# Patient Record
Sex: Female | Born: 1960 | Race: White | Hispanic: No | Smoking: Former smoker
Health system: Southern US, Community
[De-identification: ages and names within clinical notes are randomized; demographics above are authoritative.]

## PROBLEM LIST (undated history)

## (undated) DIAGNOSIS — E049 Nontoxic goiter, unspecified: Secondary | ICD-10-CM

## (undated) DIAGNOSIS — K219 Gastro-esophageal reflux disease without esophagitis: Secondary | ICD-10-CM

## (undated) DIAGNOSIS — T4145XA Adverse effect of unspecified anesthetic, initial encounter: Secondary | ICD-10-CM

## (undated) DIAGNOSIS — M1990 Unspecified osteoarthritis, unspecified site: Secondary | ICD-10-CM

## (undated) DIAGNOSIS — J189 Pneumonia, unspecified organism: Secondary | ICD-10-CM

## (undated) DIAGNOSIS — G4733 Obstructive sleep apnea (adult) (pediatric): Secondary | ICD-10-CM

## (undated) DIAGNOSIS — T7840XA Allergy, unspecified, initial encounter: Secondary | ICD-10-CM

## (undated) DIAGNOSIS — Z9889 Other specified postprocedural states: Secondary | ICD-10-CM

## (undated) DIAGNOSIS — N3281 Overactive bladder: Secondary | ICD-10-CM

## (undated) DIAGNOSIS — E041 Nontoxic single thyroid nodule: Secondary | ICD-10-CM

## (undated) DIAGNOSIS — Q828 Other specified congenital malformations of skin: Secondary | ICD-10-CM

## (undated) DIAGNOSIS — R112 Nausea with vomiting, unspecified: Secondary | ICD-10-CM

## (undated) DIAGNOSIS — R002 Palpitations: Secondary | ICD-10-CM

## (undated) DIAGNOSIS — Z87442 Personal history of urinary calculi: Secondary | ICD-10-CM

## (undated) DIAGNOSIS — R519 Headache, unspecified: Secondary | ICD-10-CM

## (undated) DIAGNOSIS — I10 Essential (primary) hypertension: Secondary | ICD-10-CM

## (undated) DIAGNOSIS — R51 Headache: Secondary | ICD-10-CM

## (undated) HISTORY — DX: Other specified congenital malformations of skin: Q82.8

## (undated) HISTORY — PX: ESOPHAGOGASTRODUODENOSCOPY: SHX1529

## (undated) HISTORY — DX: Obstructive sleep apnea (adult) (pediatric): G47.33

## (undated) HISTORY — PX: TUBAL LIGATION: SHX77

## (undated) HISTORY — DX: Overactive bladder: N32.81

## (undated) HISTORY — DX: Nontoxic goiter, unspecified: E04.9

## (undated) HISTORY — DX: Allergy, unspecified, initial encounter: T78.40XA

## (undated) HISTORY — PX: CARDIAC CATHETERIZATION: SHX172

## (undated) HISTORY — DX: Nontoxic single thyroid nodule: E04.1

## (undated) HISTORY — PX: COLONOSCOPY: SHX174

## (undated) HISTORY — PX: CHOLECYSTECTOMY: SHX55

## (undated) HISTORY — PX: KIDNEY SURGERY: SHX687

## (undated) HISTORY — DX: Palpitations: R00.2

## (undated) HISTORY — DX: Essential (primary) hypertension: I10

---

## 1981-09-20 HISTORY — PX: OTHER SURGICAL HISTORY: SHX169

## 1995-09-21 HISTORY — PX: OTHER SURGICAL HISTORY: SHX169

## 2002-04-06 ENCOUNTER — Ambulatory Visit (HOSPITAL_COMMUNITY): Admission: RE | Admit: 2002-04-06 | Discharge: 2002-04-06 | Payer: Self-pay | Admitting: Cardiology

## 2002-04-09 ENCOUNTER — Encounter: Admission: RE | Admit: 2002-04-09 | Discharge: 2002-04-09 | Payer: Self-pay | Admitting: Cardiology

## 2002-04-24 ENCOUNTER — Ambulatory Visit (HOSPITAL_COMMUNITY): Admission: RE | Admit: 2002-04-24 | Discharge: 2002-04-24 | Payer: Self-pay | Admitting: *Deleted

## 2002-04-24 ENCOUNTER — Encounter: Payer: Self-pay | Admitting: *Deleted

## 2002-05-09 ENCOUNTER — Ambulatory Visit (HOSPITAL_COMMUNITY): Admission: RE | Admit: 2002-05-09 | Discharge: 2002-05-09 | Payer: Self-pay | Admitting: *Deleted

## 2002-05-21 HISTORY — PX: CHOLECYSTECTOMY, LAPAROSCOPIC: SHX56

## 2002-05-29 ENCOUNTER — Encounter (INDEPENDENT_AMBULATORY_CARE_PROVIDER_SITE_OTHER): Payer: Self-pay

## 2002-05-29 ENCOUNTER — Encounter: Payer: Self-pay | Admitting: General Surgery

## 2002-05-29 ENCOUNTER — Observation Stay (HOSPITAL_COMMUNITY): Admission: RE | Admit: 2002-05-29 | Discharge: 2002-05-30 | Payer: Self-pay | Admitting: General Surgery

## 2002-06-26 ENCOUNTER — Other Ambulatory Visit: Admission: RE | Admit: 2002-06-26 | Discharge: 2002-06-26 | Payer: Self-pay | Admitting: *Deleted

## 2003-08-27 ENCOUNTER — Other Ambulatory Visit: Admission: RE | Admit: 2003-08-27 | Discharge: 2003-08-27 | Payer: Self-pay | Admitting: *Deleted

## 2003-09-17 ENCOUNTER — Encounter: Admission: RE | Admit: 2003-09-17 | Discharge: 2003-09-17 | Payer: Self-pay | Admitting: Family Medicine

## 2003-10-25 ENCOUNTER — Encounter: Admission: RE | Admit: 2003-10-25 | Discharge: 2003-10-25 | Payer: Self-pay | Admitting: Family Medicine

## 2004-01-28 ENCOUNTER — Encounter: Admission: RE | Admit: 2004-01-28 | Discharge: 2004-01-28 | Payer: Self-pay | Admitting: Family Medicine

## 2004-05-14 IMAGING — US US TRANSVAGINAL NON-OB
1 series · 14 of 25 positions shown · non-contrast
Comparison: none

CLINICAL DATA: Pelvic pain. 
 TRANSABDOMINAL AND ENDOVAGINAL ULTRASOUND
 The uterus measures 11.7 x 4.5 x 5.8 cm.  The endometrial stripe measures88 mm in greatest diameter.  Tiny heterogeneous focus in the fundus of the uterus likely reflects an incidental fibroid.  There are no endometrial fluid collections.

 The right ovary measures 2.9 x 1.8 x 1.7 cm.  The left ovary measures 4.2 x 2.1 x 2.8 cm.  Negative for pelvic masses.  A trace amount of fluid is visualized which is nonspecific. 
 IMPRESSION
 Probable incidental fibroid within the fundus of the uterus.  Ultrasound is otherwise negative.

[Series 1: unknown · 0.24mm/px · 14 of 57 slices shown]
[im 1/57]
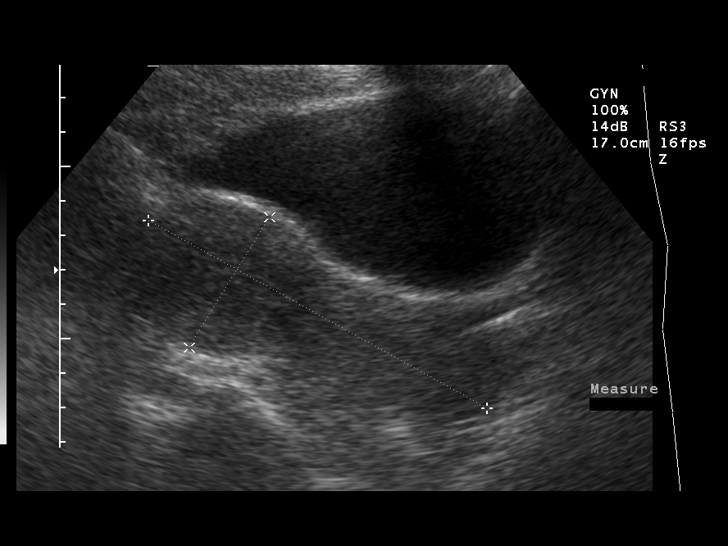
[im 5/57]
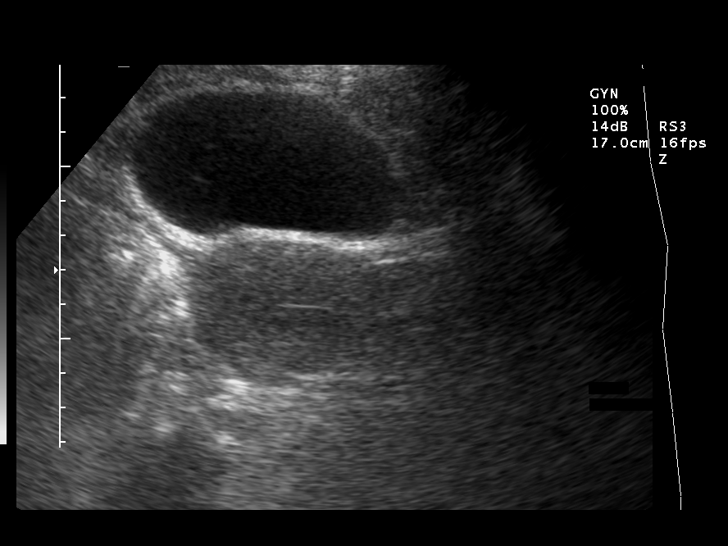
[im 10/57]
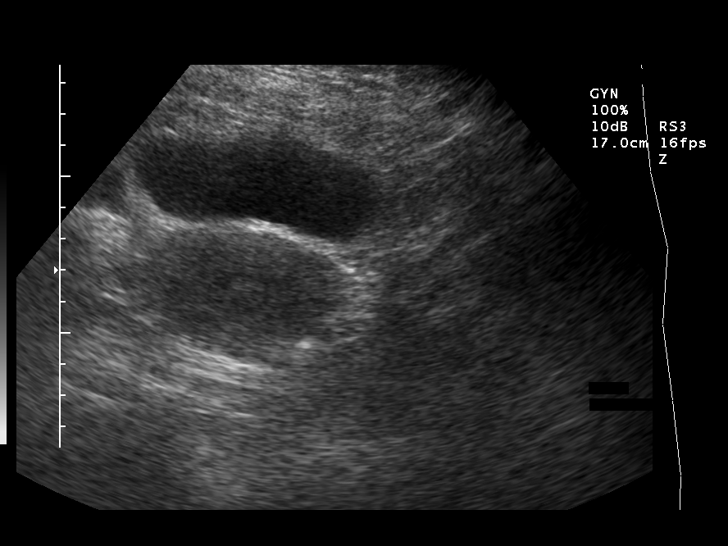
[im 15/57]
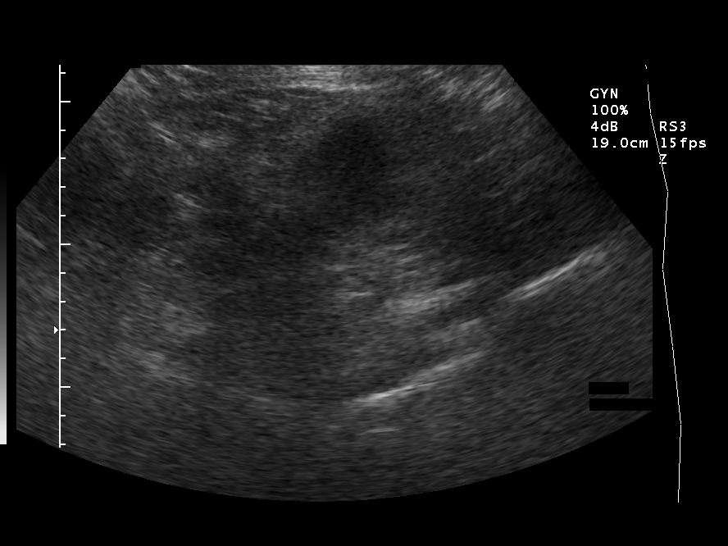
[im 19/57]
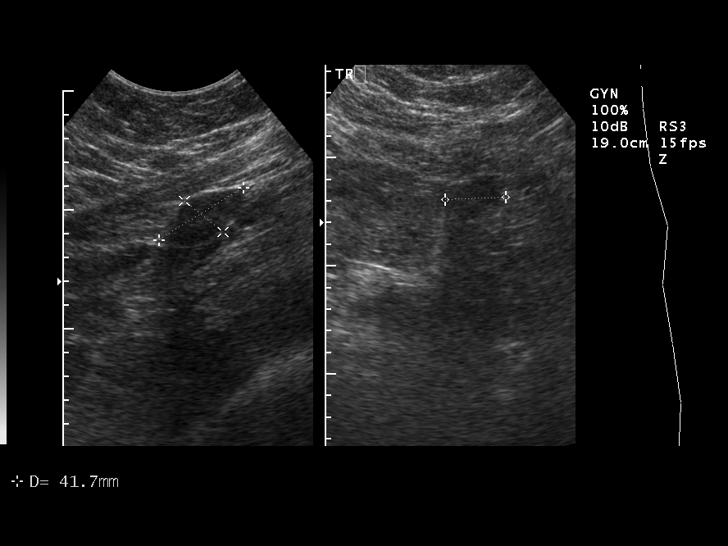
[im 22/57]
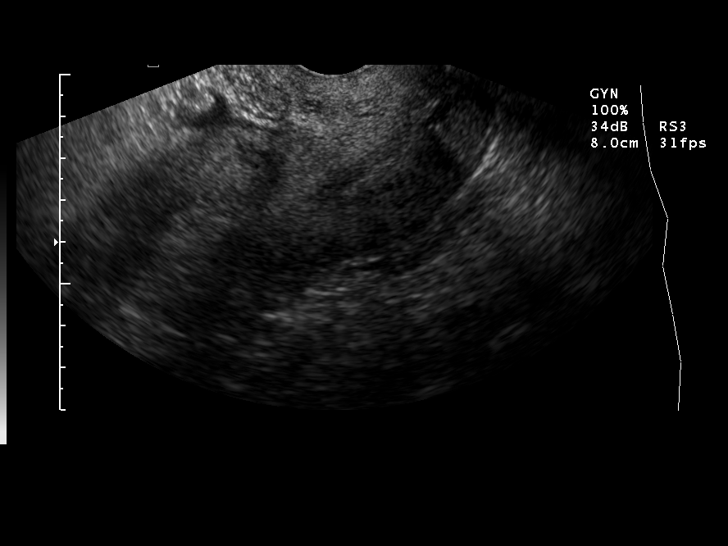
[im 26/57]
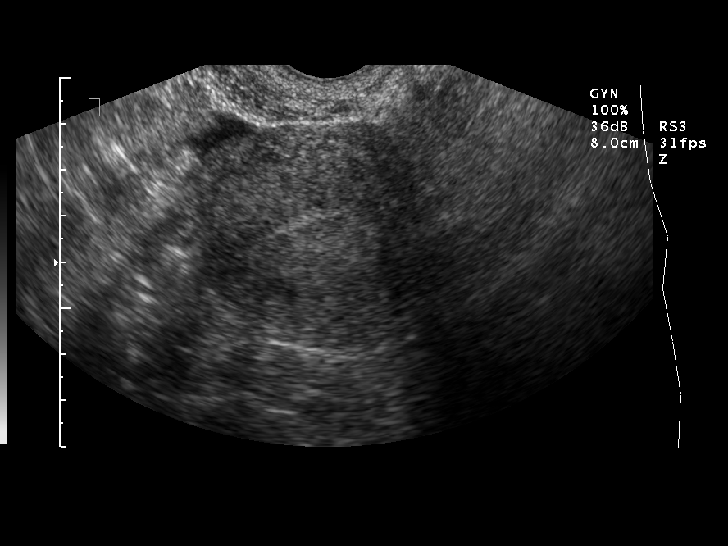
[im 31/57]
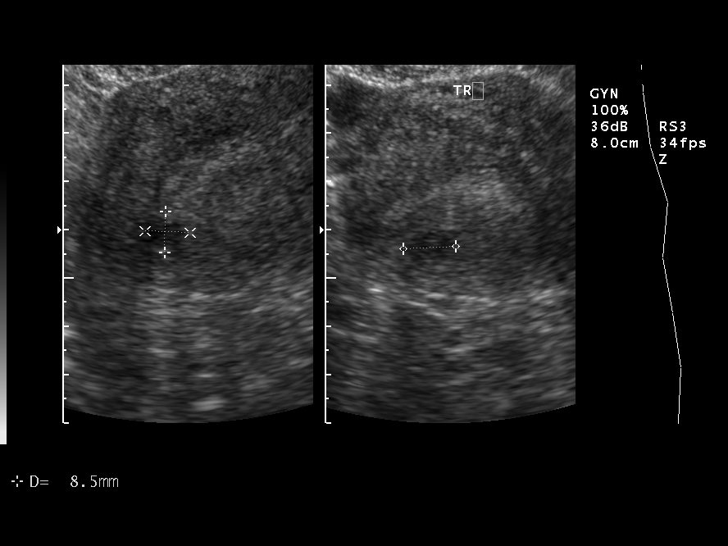
[im 36/57]
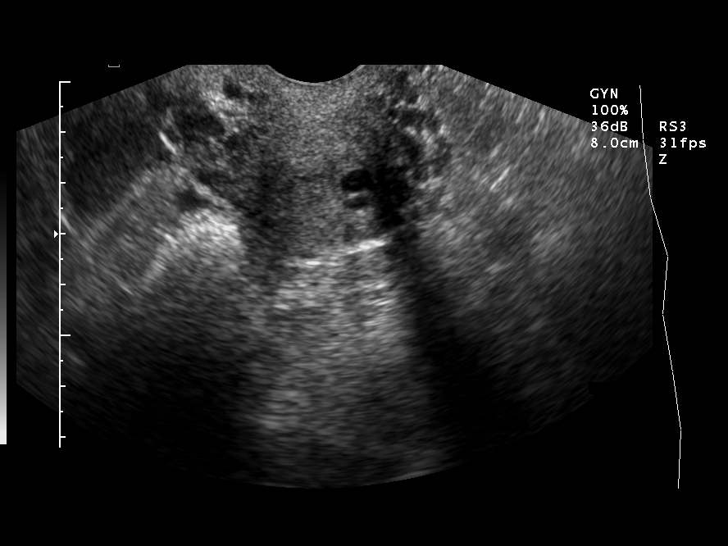
[im 38/57]
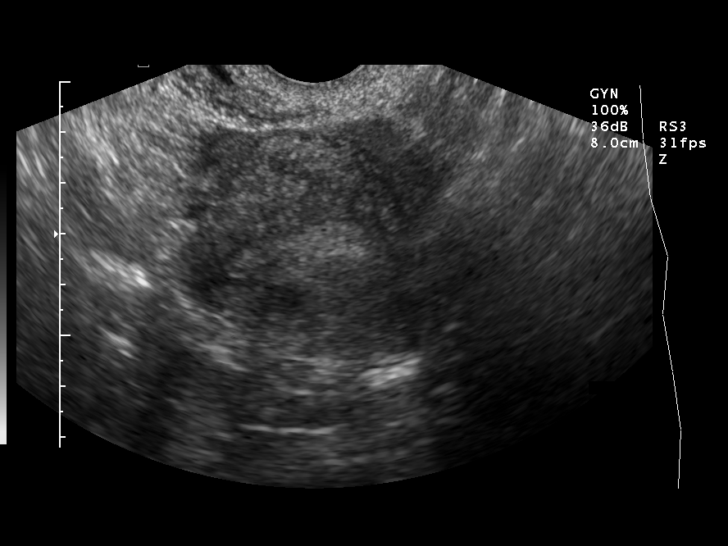
[im 43/57]
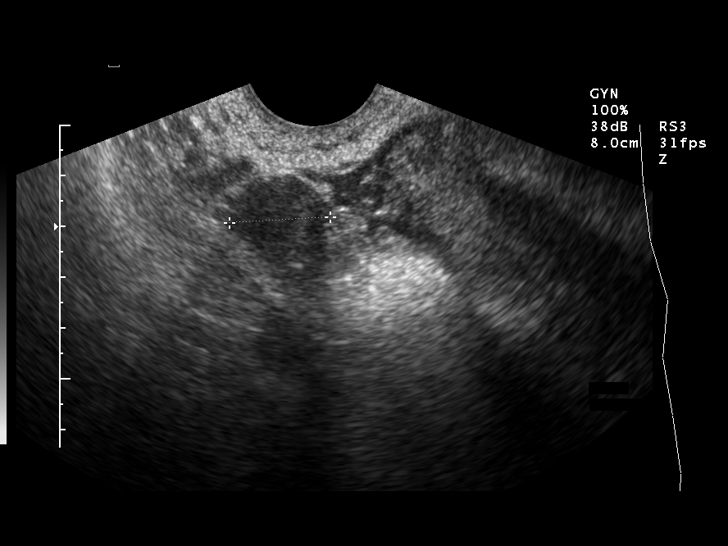
[im 47/57]
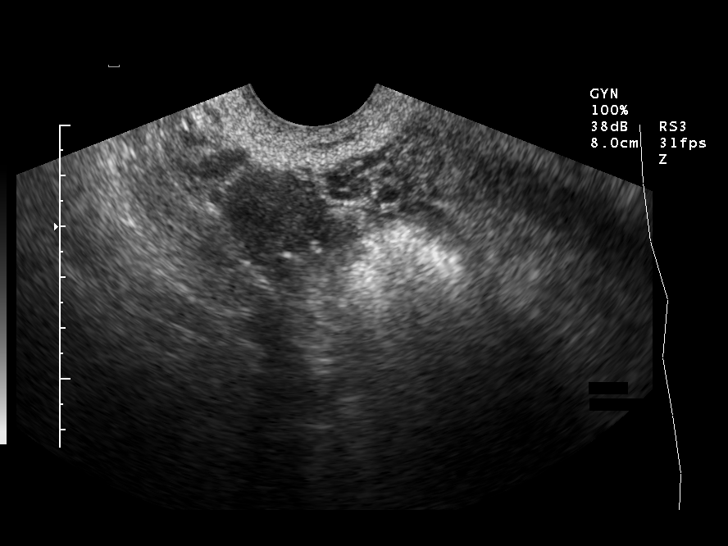
[im 52/57]
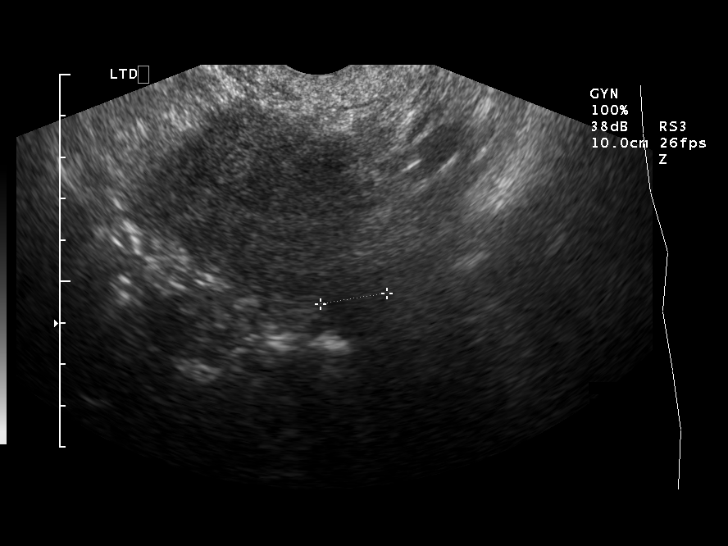
[im 57/57]
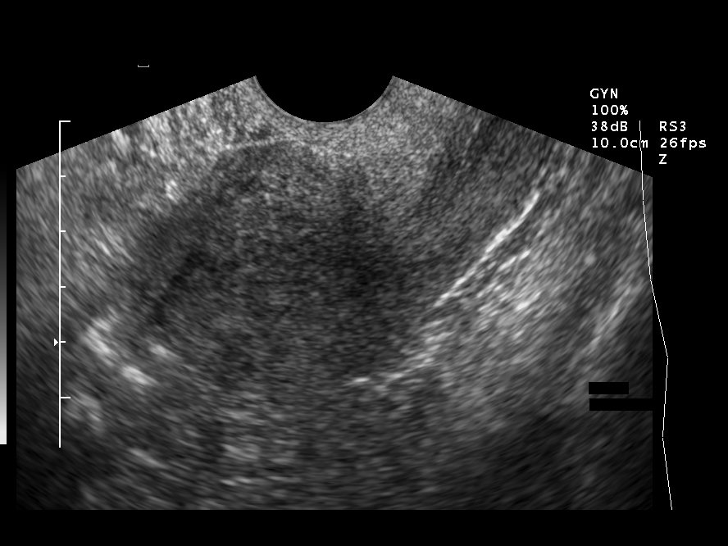

[14 of 25 positions shown; findings below may reference images not displayed]

## 2004-08-31 ENCOUNTER — Inpatient Hospital Stay (HOSPITAL_COMMUNITY): Admission: AD | Admit: 2004-08-31 | Discharge: 2004-09-03 | Payer: Self-pay | Admitting: *Deleted

## 2005-04-28 ENCOUNTER — Other Ambulatory Visit: Admission: RE | Admit: 2005-04-28 | Discharge: 2005-04-28 | Payer: Self-pay | Admitting: *Deleted

## 2005-05-12 ENCOUNTER — Encounter: Admission: RE | Admit: 2005-05-12 | Discharge: 2005-05-12 | Payer: Self-pay | Admitting: Family Medicine

## 2005-05-19 ENCOUNTER — Encounter: Admission: RE | Admit: 2005-05-19 | Discharge: 2005-05-19 | Payer: Self-pay | Admitting: Family Medicine

## 2006-06-06 ENCOUNTER — Other Ambulatory Visit: Admission: RE | Admit: 2006-06-06 | Discharge: 2006-06-06 | Payer: Self-pay | Admitting: *Deleted

## 2006-06-15 ENCOUNTER — Encounter: Admission: RE | Admit: 2006-06-15 | Discharge: 2006-06-15 | Payer: Self-pay | Admitting: Family Medicine

## 2006-06-29 ENCOUNTER — Encounter: Admission: RE | Admit: 2006-06-29 | Discharge: 2006-06-29 | Payer: Self-pay | Admitting: Family Medicine

## 2007-05-16 ENCOUNTER — Other Ambulatory Visit: Admission: RE | Admit: 2007-05-16 | Discharge: 2007-05-16 | Payer: Self-pay | Admitting: Gynecology

## 2008-05-28 ENCOUNTER — Emergency Department (HOSPITAL_COMMUNITY): Admission: EM | Admit: 2008-05-28 | Discharge: 2008-05-28 | Payer: Self-pay | Admitting: Emergency Medicine

## 2008-11-19 ENCOUNTER — Other Ambulatory Visit: Admission: RE | Admit: 2008-11-19 | Discharge: 2008-11-19 | Payer: Self-pay | Admitting: Family Medicine

## 2008-11-27 ENCOUNTER — Encounter: Admission: RE | Admit: 2008-11-27 | Discharge: 2008-11-27 | Payer: Self-pay | Admitting: Family Medicine

## 2008-11-29 ENCOUNTER — Encounter: Admission: RE | Admit: 2008-11-29 | Discharge: 2008-11-29 | Payer: Self-pay | Admitting: Family Medicine

## 2008-12-03 ENCOUNTER — Ambulatory Visit: Payer: Self-pay | Admitting: Gynecology

## 2008-12-11 ENCOUNTER — Ambulatory Visit: Payer: Self-pay | Admitting: Gynecology

## 2009-02-19 ENCOUNTER — Encounter: Admission: RE | Admit: 2009-02-19 | Discharge: 2009-02-19 | Payer: Self-pay | Admitting: Family Medicine

## 2010-10-11 ENCOUNTER — Encounter: Payer: Self-pay | Admitting: Family Medicine

## 2011-02-05 NOTE — Discharge Summary (Signed)
NAMEMICHAL, Kathryn [name] NO.:  [id number]   MEDICAL RECORD NO.:  [id number]          PATIENT TYPE:  INP   LOCATION:  5023                         FACILITY:  [facility]   PHYSICIAN:  Kela Millin, M.D.DATE OF BIRTH:  11/05/1960   DATE OF ADMISSION:  08/31/2004  DATE OF DISCHARGE:  09/03/2004                                 DISCHARGE SUMMARY   DISCHARGE DIAGNOSES:  1.  Pneumonia, bibasilar, with bronchitis.  2.  Hypokalemia - resolved.  3.  Obesity.   CONSULTATIONS:  None.   HISTORY:  The patient is a 50 year old white female who presented with  paroxysmal nonproductive cough as well as low-grade fevers and headaches.  She stated that about three weeks prior to this presentation she had had an  illness characterized by cough, headaches, and malaise that lasted about  seven days but resolved, and she did not see a doctor during that time.  During the week prior to this presentation, she stated that she was awakened  from sleep with a severe cough, and the coughing has persisted and is  keeping her up at night.  The patient stated that later the same day that  she had been awakened by the cough she went to urgent care and was given  Tussionex syrup as well as Zithromax for treatment of acute bronchitis.  She  stated that she completed the Z-PAK over five days, with not much  improvement.  On the day of presentation, the patient went to her primary  care physician's office, Dr. Clarene Duke, and had a chest x-ray done which showed  bibasilar pneumonia, and she is admitted to the [facility] hospitalist service  for further evaluation and management.   PHYSICAL EXAMINATION:  On admission, as per Dr. Sherin Quarry:  TEMPERATURE:  99.4.  PULSE:  117.  RESPIRATORY RATE:  22.  BLOOD PRESSURE:  165/96.  LUNGS:  Rhonchi at the bases.  Diminished breath sounds and wheezing with  cough.  CARDIOVASCULAR:  Normal S1, S2.  No murmurs, rubs, or gallops.  EXTREMITIES:  No cyanosis or  edema.   LABORATORY DATA:  White cell count 8.4, hemoglobin 13.5, hematocrit 39.3,  platelet count 241.  Sodium 137, potassium 3.6, chloride 105, CO2 24,  glucose 124, BUN 13, creatinine 0.4.  LFTs within normal limits.  Urinalysis  negative for infection.   HOSPITAL COURSE:  1.  Pneumonia, bibasilar.  Upon admission, blood cultures were obtained, and      the patient was empirically started on IV antibiotics - Avelox.  The      blood cultures were negative.  The patient was also started on IV      steroids on admission as well as nebulized bronchodilators - albuterol.      Following the above interventions, the wheezing improved, and the IV      Solu-Medrol was changed to prednisone, and this was tapered.  The      patient was also continued on antitussives - Tussionex, and Robitussin      DM was added p.r.n. cough.  The patient has improved clinically,  remaining afebrile and hemodynamically stable, with a decrease in her      cough, and she is tolerating p.o. well, with no November.  She will be      discharged at this time on oral antibiotics and prednisone taper, and      she is to follow up with her primary care physician, Dr. Catha Gosselin in      a week.  2.  Hypokalemia.  Her potassium was found to be decreased in the hospital,      and this was replaced.   DISCHARGE MEDICATIONS:  1.  Avelox 400 mg one p.o. daily x 7 more days.  2.  Albuterol MDI 2 puffs q.4-6 h. p.r.n.  3.  Tussionex 5 mL p.o. q.12 h. p.r.n. cough.  4.  Prednisone taper as directed.  5.  Robitussin DM 10 mL q.4-6 h. p.r.n. cough.  6.  Claritin D 10 mg one p.o. daily.   FOLLOW-UP CARE:  Dr. Catha Gosselin in one week.   DISCHARGE CONDITION:  Improved/stable.       ACV/MEDQ  D:  09/03/2004  T:  09/03/2004  Job:  [id number]   cc:   Kela Millin, M.D.   Anna Genre Little, M.D.  [address]  [place]  [place] [address]  Fax: [phone]

## 2011-02-05 NOTE — Op Note (Signed)
Kathryn [name], Kathryn [name]                         ACCOUNT NO.:  [id number]   MEDICAL RECORD NO.:  [id number]                   PATIENT TYPE:  OBV   LOCATION:  0340                                 FACILITY:  [facility]   PHYSICIAN:  Timothy E. Earlene Plater, M.D.              DATE OF BIRTH:  May 25, 1961   DATE OF PROCEDURE:  05/29/2002  DATE OF DISCHARGE:                                 OPERATIVE REPORT   PREOPERATIVE DIAGNOSIS:  Biliary dyskinesia.   POSTOPERATIVE DIAGNOSIS:  Biliary dyskinesia.   OPERATIVE PROCEDURE:  Laparoscopic cholecystectomy and operative  cholangiogram.   SURGEON:  Timothy E. Earlene Plater, M.D.   ASSISTANT:  Zigmund Daniel, M.D.   ANESTHESIA:  CRNA, supervised Jill Side, M.D.   INDICATIONS FOR PROCEDURE:  The patient is 41, obese, with persistent food-  related right upper quadrant discomfort.  Ultrasound shows fatty liver  infiltration, polyp of the gallbladder.  CCK stimulated study showed no  activity contractions of the gallbladder.  After careful consideration and  GI work-up, it has been recommended that she have a cholecystectomy, and she  agrees.   She was seen and evaluated by anesthesia, identified, and operative permit  signed.   DESCRIPTION OF PROCEDURE:  The patient was taken to the operating room,  placed supine, general endotracheal anesthesia administered.  The abdomen  was prepped, and draped in the usual fashion.  Marcaine 0.25% with  epinephrine was used prior to each incision.  An infraumbilical incision was  made, the fascia identified, opened vertically, peritoneum entered without  complication.  Hasson catheter passed, tied in place with a #1 Vicryl, the  abdomen insufflated.  Peritoneoscopy was carried out and was thought to be  normal except for the amount of fat present.  A second 10 mm trocar placed  in the mid epigastrium and two 5 mm trocars in the right upper quadrant.  The gallbladder was grasped.  It was normal in its overall  appearance.  Careful dissection at the base of the gallbladder revealed a normal-  appearing cystic duct entering the gallbladder.  This was dissected out  completely.  A clip was placed in the gallbladder.  The cystic duct was  opened, a catheter introduced percutaneously and then using real-time  fluoroscopy, dye was injected showing smooth and rapid flow of dye through  the cystic duct into the common bile duct and into the duodenum as well as  hepatic radicles filled.  We thought the common bile duct was probably  larger than usual, but no other abnormalities noted.  Catheter removed, the  cystic duct stump doubly clipped and divided.  The artery behind the cystic  duct had been triply clipped and divided.  The gallbladder was then removed  from the gallbladder bed and due to the thinness of the gallbladder, one  small nick was made in the gallbladder with some spillage of bile.  The  gallbladder was  placed in an EndoCatch bag and then removed through the  infraumbilical incision, palpated; no stones were seen, and it was passed  off the field as a surgical specimen.  Two liters of copious irrigation were  used.  The gallbladder bed was inspected.  There was no evidence or bile or  blood.  All the bile was deleted and aspirated.  With the counts correct and  the patient stable, all irrigant, CO2,  instruments, and trocars removed.  The infraumbilical incision was closed  with a #1 Vicryl under direct vision.  Counts were correct.  The skin  incisions closed with 3-0 Monocryl.  Steri-Strips applied, dry sterile  dressing, and she was removed to the recovery room in good condition.                                               Timothy E. Earlene Plater, M.D.    TED/MEDQ  D:  05/29/2002  T:  05/29/2002  Job:  [id number]   cc:   Althea Grimmer. Luther Parody, M.D.  [address], [address]  [place]  [place] [address]  Fax: [phone]   Traci R. Mayford Knife, M.D.  [address], [address]  [place]   [place] [address]  Fax: [phone]

## 2011-02-05 NOTE — H&P (Signed)
Kathryn [name], [name] NO.:  [id number]   MEDICAL RECORD NO.:  [id number]          PATIENT TYPE:  INP   LOCATION:  5023                         FACILITY:  [facility]   PHYSICIAN:  Sherin Quarry, MD      DATE OF BIRTH:  1961/03/13   DATE OF ADMISSION:  08/31/2004  DATE OF DISCHARGE:                                HISTORY & PHYSICAL   HISTORY OF PRESENT ILLNESS:  Kathryn [name] is a 50 year old lady who  indicates that about November 21, she had an illness characterized by cough,  headache and malaise.  This lasted about 7 days and then resolved.  She did  not see a doctor during this time.  She then felt well until Wednesday of  last week when she awakened from sleep with a severe cough.  Coughing has  persisted and has prevented sleep ever since.  Later the same day, she went  to Urgent Care and was given Tussionex cough syrup and Zithromax with a  diagnosis of acute bronchitis.  She took the Zithromax x5 days without much  improvement.  Since that time, she has had a persistent low-grade fever,  headache and the paroxysmal nonproductive cough.  She presented to Dr.  Fredirick Maudlin office today and a chest x-ray apparently showed bibasilar  pneumonia.  In light of this finding and her paroxysmal coughing, she is  admitted for further evaluation and treatment.   MEDICATIONS:  No medications on a regular basis.   ALLERGIES:  PENICILLIN.   PAST SURGICAL HISTORY:  1.  Cholecystectomy in 2003.  2.  Shoulder operation in 1998.  3.  Some type of surgery on her urethra when she was an infant.   PAST MEDICAL HISTORY:  She has no chronic medical illnesses.  She does  recall in 2003, she had a relatively severe episode of bronchitis.   FAMILY HISTORY:  One of her sisters has died as a result of Leukemia.  Her  brother apparently also has leukemia.  Her sister has a history of heart  disease.   SOCIAL HISTORY:  She works at a bank.  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  She does not  smoke.  She denies use of alcohol.  She has two grown children.   REVIEW OF SYSTEMS:  HEENT:  She has a chronic, bitemporal headache.  She  denies earache, sinus pain or sore throat.  CHEST:  See above.  CARDIOVASCULAR:  She denies orthopnea, PND, ankle edema or exertional chest  pain.  GASTROINTESTINAL:  She denies nausea, vomiting, abdominal pain,  change in bowel habits, melena or hematochezia.  GENITOURINARY:  She denies  dysuria, urinary frequency, hesitancy or nocturia.  RHEUMATOLOGIC:  She  denies back pain or joint pain.  HEMATOLOGIC:  She denies easy bleeding or  bruising.  NEUROLOGIC:  She denies history of seizure or stroke.   PHYSICAL EXAMINATION:  GENERAL:  She is a pleasant, cooperative woman who is  coughing profusely.  HEENT:  Within normal limits.  CHEST:  Difficult because of her  persistent coughing.  I hear a few rhonchi  at the bases.  Breath sounds seem to be somewhat diminished.  There is  definite wheezing audible with coughing.  CARDIAC:  Normal S1, S2 without murmurs, rubs or gallops.  ABDOMEN:  Obese, normal bowel sounds without masses or tenderness.  There is  no guarding or rebound.  EXTREMITIES:  Evaluation of the extremities is normal.  No cyanosis or  edema.   IMPRESSION:  1.  After review of the patient's chest x-ray, I agree that the patient has      evidence of bibasilar pneumonia and bronchitis.  We will admit her at      this time for evaluation and treatment of these problems.  2.  History of cholecystectomy.  3.  Obesity.  4.  History of shoulder surgery.  5.  Penicillin allergy.   PLAN:  1.  The patient will be given oxygen and intravenous fluids.  2.  Antibiotics in the form of Avelox 400 mg daily.  3.  Will give her nebulizer treatments with albuterol as well as intravenous      steroids in light of the persistent coughing.  4.  Pain medicines will be given as needed.  5.  Will  follow her course closely.       SY/MEDQ  D:  08/31/2004  T:  08/31/2004  Job:  [id number]   cc:   Caryn Bee L. Little, M.D.  [address]  [place]  [place] [address]  Fax: [phone]

## 2011-06-12 ENCOUNTER — Emergency Department (HOSPITAL_COMMUNITY)
Admission: EM | Admit: 2011-06-12 | Discharge: 2011-06-12 | Disposition: A | Discharge status: Home / Self Care | Payer: Self-pay | Attending: Emergency Medicine | Admitting: Emergency Medicine

## 2011-06-12 DIAGNOSIS — T7840XA Allergy, unspecified, initial encounter: Secondary | ICD-10-CM | POA: Insufficient documentation

## 2011-06-12 DIAGNOSIS — I10 Essential (primary) hypertension: Secondary | ICD-10-CM | POA: Insufficient documentation

## 2011-06-23 LAB — CBC
HCT: 41
Hemoglobin: 13.5
MCHC: 33
MCV: 86.5
Platelets: 238
RBC: 4.74
RDW: 14
WBC: 8.8

## 2011-06-23 LAB — COMPREHENSIVE METABOLIC PANEL
ALT: 28
AST: 23
Alkaline Phosphatase: 93
CO2: 25
Calcium: 9.1
Chloride: 106
GFR calc Af Amer: >60
GFR calc non Af Amer: >60
Glucose, Bld: 97
Potassium: 4
Sodium: 140
Total Bilirubin: 0.7

## 2011-06-23 LAB — URINALYSIS, ROUTINE W REFLEX MICROSCOPIC
Bilirubin Urine: NEGATIVE
Glucose, UA: NEGATIVE
Hgb urine dipstick: NEGATIVE
Nitrite: NEGATIVE
Protein, ur: NEGATIVE
Specific Gravity, Urine: 1.02
Urobilinogen, UA: 0.2
pH: 6

## 2011-06-23 LAB — DIFFERENTIAL
Basophils Relative: 0
Eosinophils Absolute: 0.2
Eosinophils Relative: 2
Lymphs Abs: 3.3
Neutrophils Relative %: 53

## 2011-06-23 LAB — LIPASE, BLOOD: Lipase: 17

## 2012-01-25 ENCOUNTER — Other Ambulatory Visit: Payer: Self-pay | Admitting: Family Medicine

## 2012-01-25 ENCOUNTER — Other Ambulatory Visit (HOSPITAL_COMMUNITY)
Admission: RE | Admit: 2012-01-25 | Discharge: 2012-01-25 | Disposition: A | Discharge status: Home / Self Care | Payer: Self-pay | Source: Ambulatory Visit | Attending: Family Medicine | Admitting: Family Medicine

## 2012-01-25 DIAGNOSIS — Z1159 Encounter for screening for other viral diseases: Secondary | ICD-10-CM | POA: Insufficient documentation

## 2012-01-25 DIAGNOSIS — Z124 Encounter for screening for malignant neoplasm of cervix: Secondary | ICD-10-CM | POA: Insufficient documentation

## 2013-05-17 ENCOUNTER — Other Ambulatory Visit: Payer: Self-pay | Admitting: Family Medicine

## 2013-05-17 DIAGNOSIS — E041 Nontoxic single thyroid nodule: Secondary | ICD-10-CM

## 2013-05-24 ENCOUNTER — Ambulatory Visit
Admission: RE | Admit: 2013-05-24 | Discharge: 2013-05-24 | Disposition: A | Discharge status: Home / Self Care | Payer: BC Managed Care – PPO | Source: Ambulatory Visit | Attending: Family Medicine | Admitting: Family Medicine

## 2013-05-24 DIAGNOSIS — E041 Nontoxic single thyroid nodule: Secondary | ICD-10-CM

## 2013-05-30 ENCOUNTER — Other Ambulatory Visit: Payer: Self-pay

## 2013-05-30 DIAGNOSIS — Z1231 Encounter for screening mammogram for malignant neoplasm of breast: Secondary | ICD-10-CM

## 2013-06-20 ENCOUNTER — Ambulatory Visit
Admission: RE | Admit: 2013-06-20 | Discharge: 2013-06-20 | Disposition: A | Discharge status: Home / Self Care | Payer: BC Managed Care – PPO | Source: Ambulatory Visit

## 2013-06-20 DIAGNOSIS — Z1231 Encounter for screening mammogram for malignant neoplasm of breast: Secondary | ICD-10-CM

## 2014-10-23 ENCOUNTER — Encounter: Payer: Self-pay | Admitting: Internal Medicine

## 2014-10-23 ENCOUNTER — Encounter (INDEPENDENT_AMBULATORY_CARE_PROVIDER_SITE_OTHER): Payer: Self-pay

## 2014-10-23 ENCOUNTER — Ambulatory Visit (INDEPENDENT_AMBULATORY_CARE_PROVIDER_SITE_OTHER): Payer: 59 | Admitting: Internal Medicine

## 2014-10-23 VITALS — BP 160/100 | HR 70 | Temp 98.7°F | Ht 62.0 in | Wt 238.0 lb

## 2014-10-23 DIAGNOSIS — R9389 Abnormal findings on diagnostic imaging of other specified body structures: Secondary | ICD-10-CM

## 2014-10-23 DIAGNOSIS — I10 Essential (primary) hypertension: Secondary | ICD-10-CM

## 2014-10-23 DIAGNOSIS — R938 Abnormal findings on diagnostic imaging of other specified body structures: Secondary | ICD-10-CM

## 2014-10-23 DIAGNOSIS — R058 Other specified cough: Secondary | ICD-10-CM

## 2014-10-23 DIAGNOSIS — R05 Cough: Secondary | ICD-10-CM

## 2014-10-23 MED ORDER — VALSARTAN-HYDROCHLOROTHIAZIDE 160-25 MG PO TABS: 1.0000 | ORAL_TABLET | Freq: Every day | ORAL | Status: DC

## 2014-10-23 NOTE — Progress Notes (Signed)
   Subjective:    Patient ID: Kathryn [name], female    DOB: 06/12/1961,   MRN: [id number]  HPI  [id number] yowf never smoker never respiratory problem placed on ACEi fall of 2015 with onset of cough Sep 21 2013 with dx of pna refractory to FQ referred to pulmonary clinic 10/23/2014 by Dr Barbaraann Barthelankins with neg pertussis AB  10/10/14 but persistent mid lung atx on cxr.   10/23/2014 1st [facility] office visit/ Kathryn [name]   Chief Complaint  Patient presents with  . Pulmonary Consult    Referred by Dr. Gwynneth MacleodLouisa Miller. Pt states dxed with PNA 09/20/14- has been txed with 3 rounds of abx since then. She c/o minimal cough and trouble with taking in a deep enough breath.   abrupt onset cough Sep 21 2014 assoc initially with  low grade fever/ chills / never productive assoc with choking/ gagging but not vomiting and rx levaquin, steroids/ inhalers > some better with change in cough to harsh shrill upper airway pattern only and resoltion of all other symptoms  No obvious other patterns in day to day or daytime variabilty or assoc sob unless coughing which occurs with each deep breath   or cp or chest tightness, subjective wheeze overt sinus or hb symptoms. No unusual exp hx or h/o childhood pna/ asthma or knowledge of premature birth.  Sleeping ok without nocturnal  or early am exacerbation  of respiratory  c/o's or need for noct saba. Also denies any obvious fluctuation of symptoms with weather or environmental changes or other aggravating or alleviating factors except as outlined above   Current Medications, Allergies, Complete Past Medical History, Past Surgical History, Family History, and Social History were reviewed in [facility] electronic medical record.             Review of Systems  Constitutional: Negative for fever, chills and unexpected weight change.  HENT: Negative for congestion, dental problem, ear pain, nosebleeds, postnasal drip, rhinorrhea, sinus pressure, sneezing, sore throat, trouble  swallowing and voice change.   Eyes: Negative for visual disturbance.  Respiratory: Positive for cough and shortness of breath. Negative for choking.   Cardiovascular: Negative for chest pain and leg swelling.  Gastrointestinal: Negative for vomiting, abdominal pain and diarrhea.  Genitourinary: Negative for difficulty urinating.  Musculoskeletal: Negative for arthralgias.  Skin: Negative for rash.  Neurological: Negative for tremors, syncope and headaches.  Hematological: Does not bruise/bleed easily.       Objective:   Physical Exam  amb wf with harsh classic upper airway barking cough  Wt Readings from Last 3 Encounters:  10/23/14 238 lb (107.956 kg)    Vital signs reviewed   HEENT: nl dentition, turbinates, and orophanx. Nl external ear canals without cough reflex   NECK :  without JVD/Nodes/TM/ nl carotid upstrokes bilaterally   LUNGS: no acc muscle use, clear to A and P bilaterally with  cough on insp  Maneuvers at mid insp   CV:  RRR  no s3 or murmur or increase in P2, no edema   ABD:  soft and nontender with nl excursion in the supine position. No bruits or organomegaly, bowel sounds nl  MS:  warm without deformities, calf tenderness, cyanosis or clubbing  SKIN: warm and dry without lesions    NEURO:  alert, approp, no deficits   cxr 10/10/14  L mid lung infiltrate       Assessment & Plan:

## 2014-10-23 NOTE — Patient Instructions (Addendum)
Stop lisinopril   Start diovan (valsartan)  160/25 one daily   Try prilosec 20mg   Take 30-60 min before first meal of the day and Pepcid 20 mg one bedtime until cough is completely gone for at least a week without the need for cough suppression  GERD (REFLUX)  is an extremely common cause of respiratory symptoms just like yours , many times with no obvious heartburn at all.    It can be treated with medication, but also with lifestyle changes including avoidance of late meals, excessive alcohol, smoking cessation, and avoid fatty foods, chocolate, peppermint, colas, red wine, and acidic juices such as orange juice.  NO MINT OR MENTHOL PRODUCTS SO NO COUGH DROPS  USE SUGARLESS CANDY INSTEAD (Jolley ranchers or Stover's or Life Savers) or even ice chips will also do - the key is to swallow to prevent all throat clearing. NO OIL BASED VITAMINS - use powdered substitutes.       Please schedule a follow up office visit in 2 weeks, sooner if needed to see Tammy NP with cxr on return  Late add if still coughing or cxr abn > ct sinus and chest

## 2014-10-24 DIAGNOSIS — I10 Essential (primary) hypertension: Secondary | ICD-10-CM | POA: Insufficient documentation

## 2014-10-24 DIAGNOSIS — R058 Other specified cough: Secondary | ICD-10-CM | POA: Insufficient documentation

## 2014-10-24 DIAGNOSIS — R9389 Abnormal findings on diagnostic imaging of other specified body structures: Secondary | ICD-10-CM | POA: Insufficient documentation

## 2014-10-24 DIAGNOSIS — R059 Cough, unspecified: Secondary | ICD-10-CM

## 2014-10-24 DIAGNOSIS — R05 Cough: Secondary | ICD-10-CM | POA: Insufficient documentation

## 2014-10-24 HISTORY — DX: Cough, unspecified: R05.9

## 2014-10-24 NOTE — Assessment & Plan Note (Addendum)
ACE inhibitors are problematic in  pts with airway complaints because  even experienced pulmonologists can't always distinguish ace effects from copd/asthma.  By themselves they don't actually cause a problem, much like oxygen can't by itself start a fire, but they certainly serve as a powerful catalyst or enhancer for any "fire"  or inflammatory process in the upper airway, be it caused by an ET  tube or more commonly reflux (especially in the obese or pts with known GERD or who are on biphoshonates).    In the era of ARB near equivalency until we have a better handle on the reversibility of the airway problem, it just makes sense to avoid ACEI  entirely in the short run - it's the only way I know to tell what effect the acei is having on the cough at this point   Try diovan  160/25 one daily

## 2014-10-24 NOTE — Assessment & Plan Note (Signed)
The most common causes of chronic cough in immunocompetent adults include the following: upper airway cough syndrome (UACS), previously referred to as postnasal drip syndrome (PNDS), which is caused by variety of rhinosinus conditions; (2) asthma; (3) GERD; (4) chronic bronchitis from cigarette smoking or other inhaled environmental irritants; (5) nonasthmatic eosinophilic bronchitis; and (6) bronchiectasis.   These conditions, singly or in combination, have accounted for up to 94% of the causes of chronic cough in prospective studies.   Other conditions have constituted no >6% of the causes in prospective studies These have included bronchogenic carcinoma, chronic interstitial pneumonia, sarcoidosis, left ventricular failure, ACEI-induced cough, and aspiration from a condition associated with pharyngeal dysfunction.    Chronic cough is often simultaneously caused by more than one condition. A single cause has been found from 38 to 82% of the time, multiple causes from 18 to 62%. Multiply caused cough has been the result of three diseases up to 42% of the time.       Based on hx and exam, this is most likely:  Classic Upper airway cough syndrome, so named because it's frequently impossible to sort out how much is  CR/sinusitis with freq throat clearing (which can be related to primary GERD)   vs  causing  secondary (" extra esophageal")  GERD from wide swings in gastric pressure that occur with throat clearing, often  promoting self use of mint and menthol lozenges that reduce the lower esophageal sphincter tone and exacerbate the problem further in a cyclical fashion.   These are the same pts (now being labeled as having "irritable larynx syndrome" by some cough centers) who not infrequently have a history of having failed to tolerate ace inhibitors,  dry powder inhalers or biphosphonates or report having atypical reflux symptoms that don't respond to standard doses of PPI , and are easily confused as  having aecopd or asthma flares by even experienced allergists/ pulmonologists.   The first step is to maximize acid suppression and eliminate ACEi  then regroup in 2 weeks with cxr   See instructions for specific recommendations which were reviewed directly with the patient who was given a copy with highlighter outlining the key components.

## 2014-10-24 NOTE — Assessment & Plan Note (Signed)
Onset of symptoms abrupt 09/21/14/ never smoker   This is perfectly c/w CAP with cxr lag and no need to repeat cxr for another 2 weeks in this setting  > consider sinus and chest  CT next ov depending on control of symptoms and whether cxr has normalized by then

## 2014-11-07 ENCOUNTER — Ambulatory Visit: Payer: 59 | Admitting: Adult Health

## 2014-11-14 ENCOUNTER — Encounter: Payer: Self-pay | Admitting: Adult Health

## 2014-11-14 ENCOUNTER — Ambulatory Visit (INDEPENDENT_AMBULATORY_CARE_PROVIDER_SITE_OTHER)
Admission: RE | Admit: 2014-11-14 | Discharge: 2014-11-14 | Disposition: A | Discharge status: Home / Self Care | Payer: 59 | Source: Ambulatory Visit | Attending: Adult Health | Admitting: Adult Health

## 2014-11-14 ENCOUNTER — Ambulatory Visit (INDEPENDENT_AMBULATORY_CARE_PROVIDER_SITE_OTHER): Payer: 59 | Admitting: Adult Health

## 2014-11-14 VITALS — BP 126/84 | HR 89 | Temp 98.3°F | Ht 62.0 in | Wt 239.0 lb

## 2014-11-14 DIAGNOSIS — J189 Pneumonia, unspecified organism: Secondary | ICD-10-CM

## 2014-11-14 DIAGNOSIS — R9389 Abnormal findings on diagnostic imaging of other specified body structures: Secondary | ICD-10-CM

## 2014-11-14 DIAGNOSIS — I10 Essential (primary) hypertension: Secondary | ICD-10-CM

## 2014-11-14 DIAGNOSIS — R058 Other specified cough: Secondary | ICD-10-CM

## 2014-11-14 DIAGNOSIS — R938 Abnormal findings on diagnostic imaging of other specified body structures: Secondary | ICD-10-CM

## 2014-11-14 DIAGNOSIS — R05 Cough: Secondary | ICD-10-CM

## 2014-11-14 NOTE — Assessment & Plan Note (Signed)
Repeat chest x-ray today shows clearance of previous pneumonia

## 2014-11-14 NOTE — Patient Instructions (Signed)
Begin Mucinex twice daily as needed for cough and congestion Begin Delsym 2 teaspoons twice daily as needed for cough Saline nasal rinses as needed We are setting, you up for CT sinus Remain off ACE inhibitor-lisinopril Follow-up with primary care physician for your blood pressure management Follow Dr. Sherene SiresWert in 4-6 weeks Please contact office for sooner follow up if symptoms do not improve or worsen or seek emergency care

## 2014-11-14 NOTE — Assessment & Plan Note (Signed)
Resistant cough on ACE inhibitor Would avoid ACE inhibitor's in the future if possible Advised on cough control Check CT sinus

## 2014-11-14 NOTE — Assessment & Plan Note (Signed)
Controlled on current regimen Taken off of her ACE inhibitor due to upper airway cough He is to follow-up with her primary care physician for blood pressure management in the future

## 2014-11-14 NOTE — Progress Notes (Signed)
   Subjective:    Patient ID: Kathryn [name], female    DOB: 05/27/1961,   MRN: [id number]  HPI  [id number] yowf never smoker never respiratory problem placed on ACEi fall of 2015 with onset of cough Sep 21 2013 with dx of pna refractory to FQ referred to pulmonary clinic 10/23/2014 by Dr Barbaraann Barthelankins with neg pertussis AB  10/10/14 but persistent mid lung atx on cxr.   10/23/2014 1st [facility] office visit/ Wert   Chief Complaint  Patient presents with  . Pulmonary Consult    Referred by Dr. Gwynneth MacleodLouisa Miller. Pt states dxed with PNA 09/20/14- has been txed with 3 rounds of abx since then. She c/o minimal cough and trouble with taking in a deep enough breath.   abrupt onset cough Sep 21 2014 assoc initially with  low grade fever/ chills / never productive assoc with choking/ gagging but not vomiting and rx levaquin, steroids/ inhalers > some better with change in cough to harsh shrill upper airway pattern only and resoltion of all other symptoms >d/c ACE   11/14/2014 Follow up Cough and PNA  Patient returns for a two-week follow-up for pneumonia Recently diagnosed with a left upper lobe pneumonia  , slow to resolve . She had 3 antibiotics with persistent symptoms . Last visit. She was taken off of her ACE inhibitor . He says she was starting to feel quite a bit better, cough had almost totally resolved . However over the last 3 days. She has started developing nasal congestion, post nasal drip and dry cough . She denies any chest pain, orthopnea, PND or leg swelling    Current Medications, Allergies, Complete Past Medical History, Past Surgical History, Family History, and Social History were reviewed in [facility] electronic medical record.             Review of Systems  Constitutional: Negative for fever, chills and unexpected weight change.  HENT: Negative for congestion, dental problem, ear pain, nosebleeds, postnasal drip, rhinorrhea, sinus pressure, sneezing, sore throat, trouble swallowing  and voice change.   Eyes: Negative for visual disturbance.  Respiratory: Positive for cough and shortness of breath. Negative for choking.   Cardiovascular: Negative for chest pain and leg swelling.  Gastrointestinal: Negative for vomiting, abdominal pain and diarrhea.  Genitourinary: Negative for difficulty urinating.  Musculoskeletal: Negative for arthralgias.  Skin: Negative for rash.  Neurological: Negative for tremors, syncope and headaches.  Hematological: Does not bruise/bleed easily.       Objective:   Physical Exam  amb wf with   Vital signs reviewed   HEENT: nl dentition, turbinates, and orophanx. Nl external ear canals without cough reflex   NECK :  without JVD/Nodes/TM/ nl carotid upstrokes bilaterally   LUNGS: no acc muscle use, clear to A and P bilaterally with  cough on insp  Maneuvers at mid insp   CV:  RRR  no s3 or murmur or increase in P2, no edema   ABD:  soft and nontender with nl excursion in the supine position. No bruits or organomegaly, bowel sounds nl  MS:  warm without deformities, calf tenderness, cyanosis or clubbing  SKIN: warm and dry without lesions    NEURO:  alert, approp, no deficits   cxr 10/10/14  L mid lung infiltrate    /11/14/2014 CXR  Clear  Reviewed independently    Assessment & Plan:

## 2014-11-20 ENCOUNTER — Ambulatory Visit (INDEPENDENT_AMBULATORY_CARE_PROVIDER_SITE_OTHER)
Admission: RE | Admit: 2014-11-20 | Discharge: 2014-11-20 | Disposition: A | Discharge status: Home / Self Care | Payer: 59 | Source: Ambulatory Visit | Attending: Adult Health | Admitting: Adult Health

## 2014-11-20 DIAGNOSIS — R058 Other specified cough: Secondary | ICD-10-CM

## 2014-11-20 DIAGNOSIS — R05 Cough: Secondary | ICD-10-CM

## 2014-11-21 ENCOUNTER — Telehealth: Payer: Self-pay | Admitting: Internal Medicine

## 2014-11-21 NOTE — Telephone Encounter (Signed)
Kathryn [name], please advise on CT sinus results thanks

## 2014-11-21 NOTE — Telephone Encounter (Signed)
LMTCB

## 2014-11-22 NOTE — Telephone Encounter (Signed)
LMTCB x2  

## 2014-11-22 NOTE — Telephone Encounter (Signed)
Notes Recorded by Julio Sicksammy S Parrett, NP on 11/21/2014 at 6:02 PM No sign of sinus infectioin  Cont w/ ov recs  Please contact office for sooner follow up if symptoms do not improve or worsen or seek emergency care  -- Pt is aware of results. Nothing further was needed.

## 2014-12-12 ENCOUNTER — Ambulatory Visit: Payer: 59 | Admitting: Internal Medicine

## 2015-06-04 IMAGING — CR DG CHEST 2V
2 series · 2 of 2 positions shown · non-contrast
Comparison: October 10, 2014

CLINICAL DATA: Recent pneumonia.  Persistent cough for 6 days

EXAM:
CHEST  2 VIEW

[view not recorded (1 of 2)]
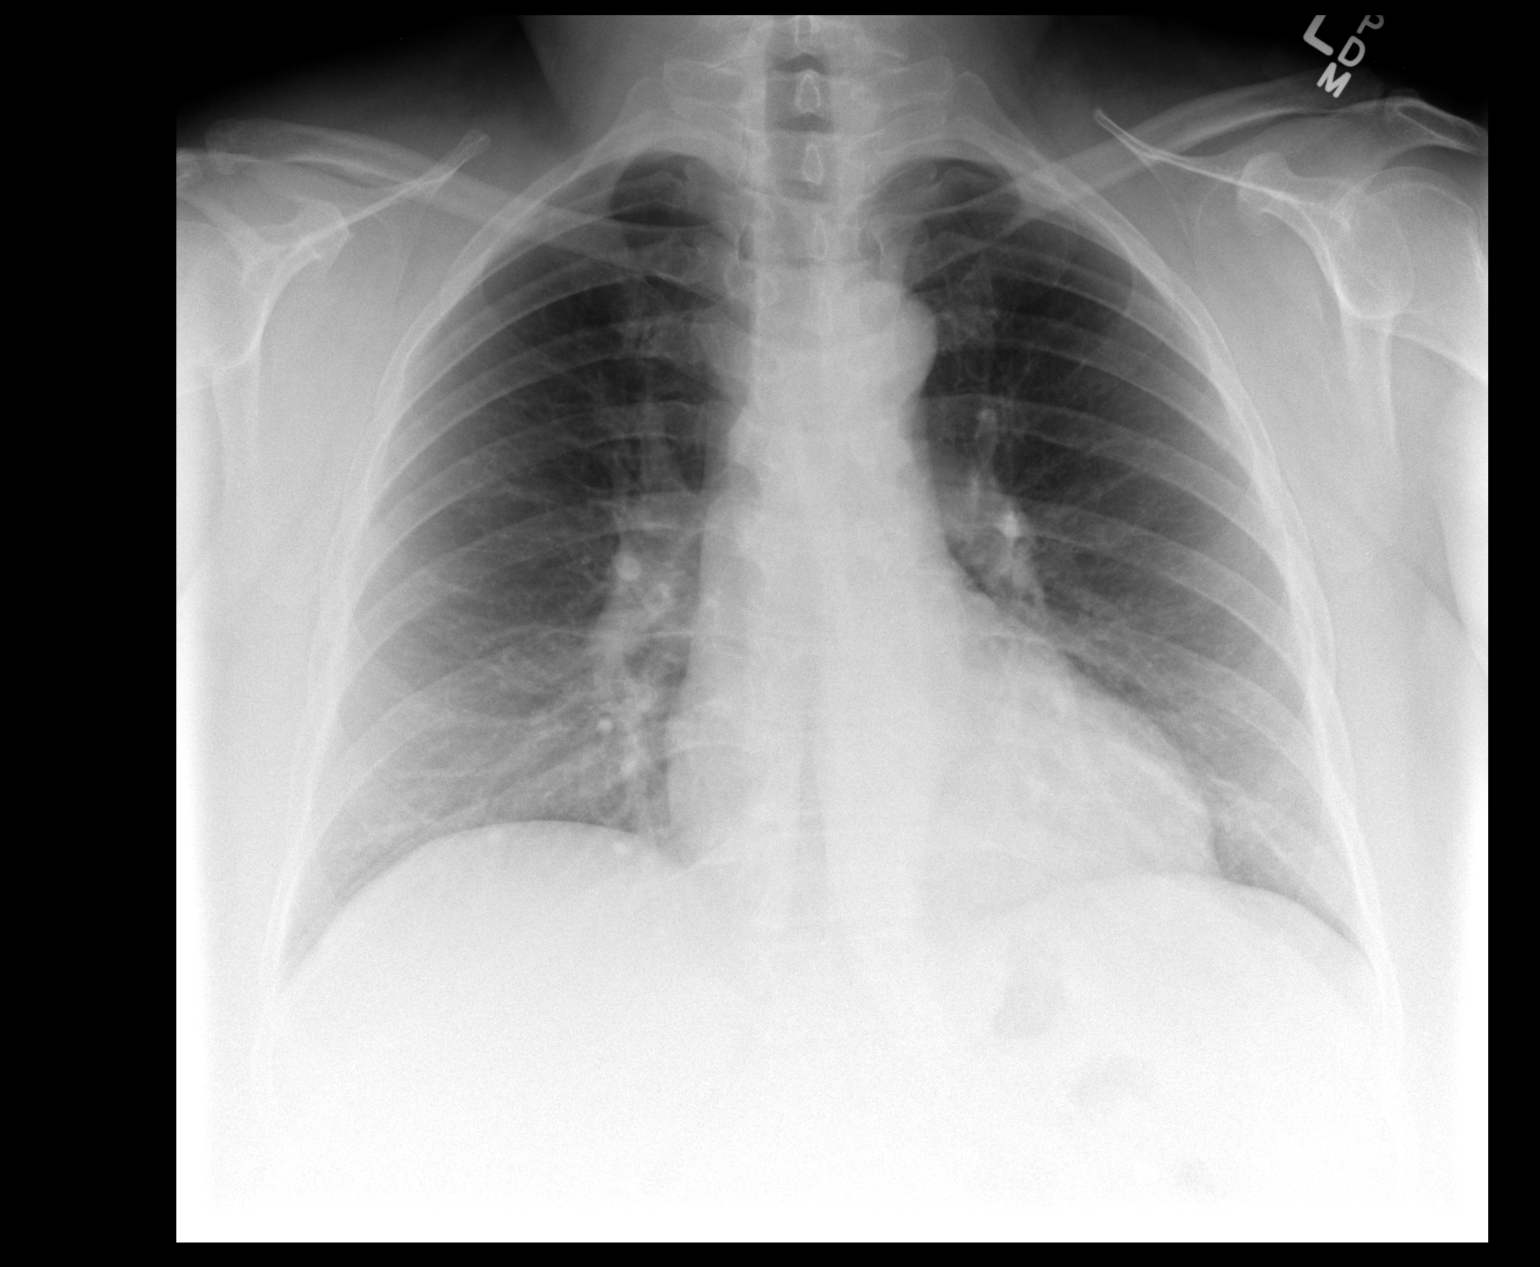

[view not recorded (2 of 2)]
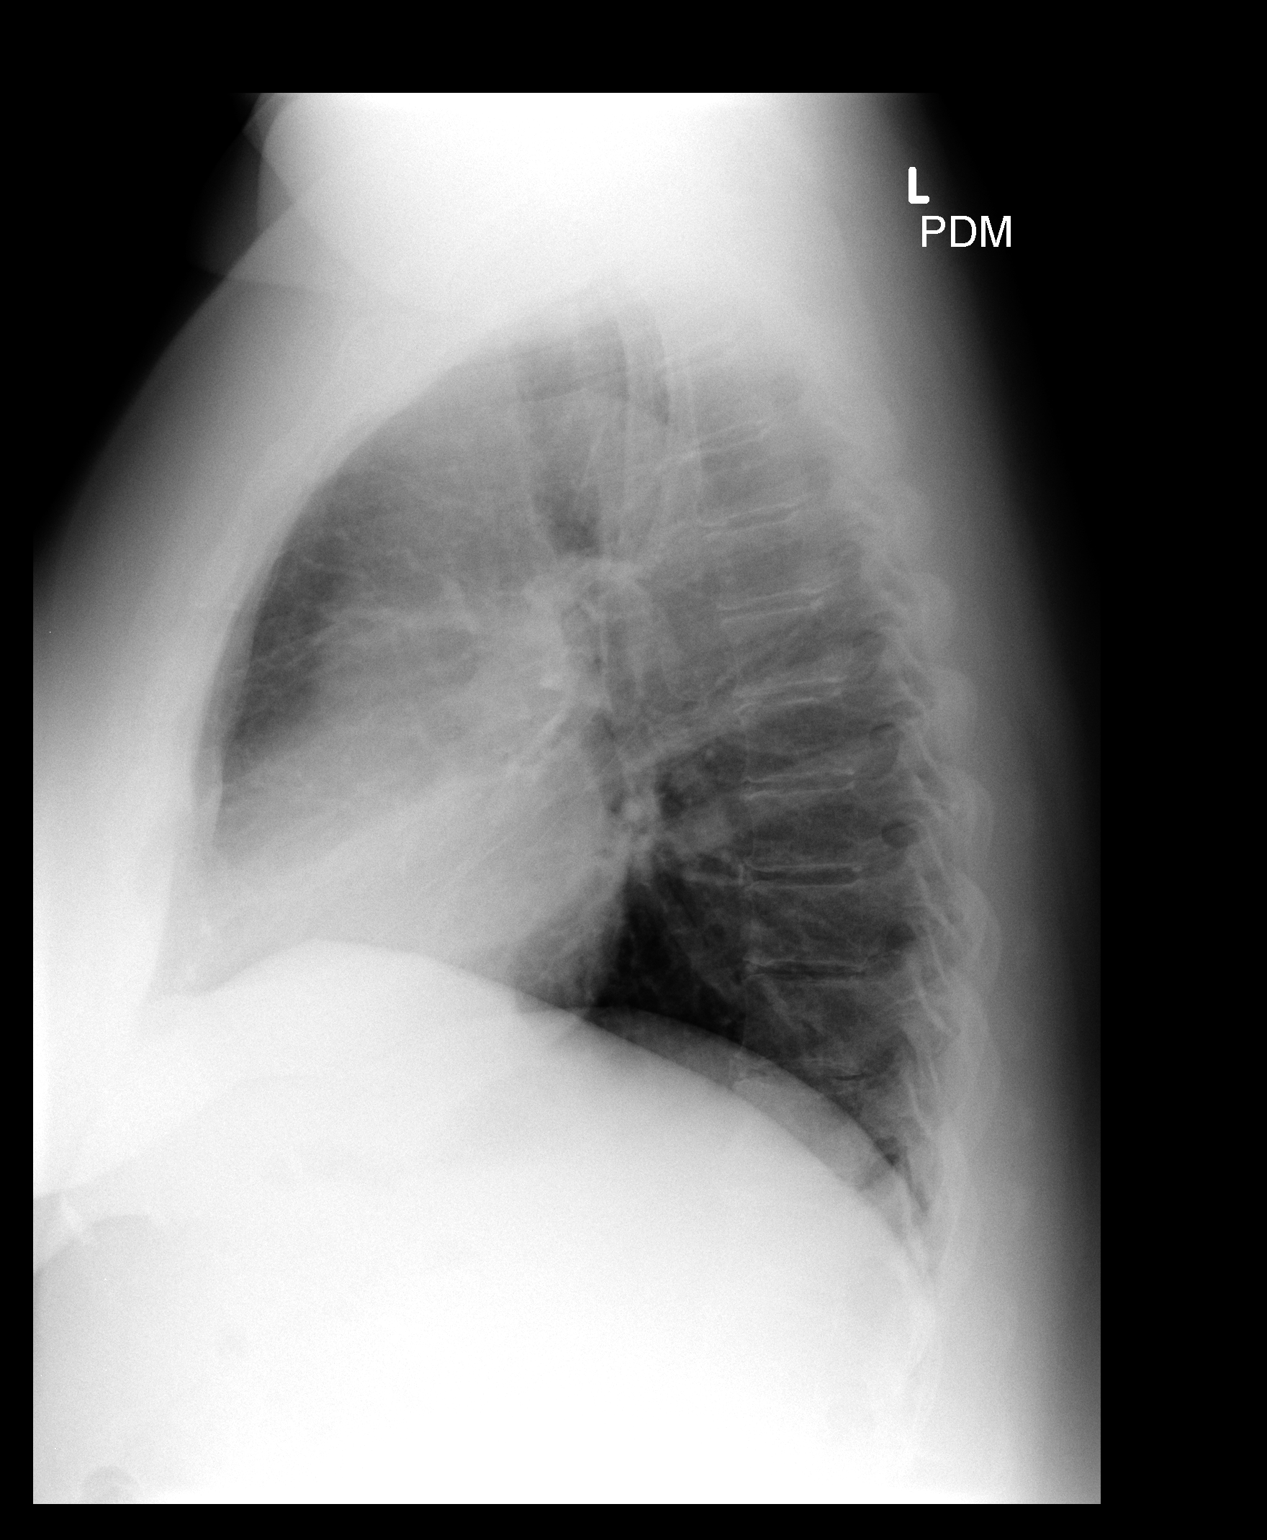

[2 of 2 positions shown; findings below may reference images not displayed]

FINDINGS: The previously noted left upper lobe infiltrate has cleared.
Currently lungs are clear. Heart size and pulmonary vascularity are
normal. No adenopathy. No bone lesions.
IMPRESSION: Lungs now clear.  No new opacity.

## 2015-10-30 ENCOUNTER — Other Ambulatory Visit: Payer: Self-pay | Admitting: Family Medicine

## 2015-10-30 DIAGNOSIS — E042 Nontoxic multinodular goiter: Secondary | ICD-10-CM

## 2015-11-04 ENCOUNTER — Ambulatory Visit
Admission: RE | Admit: 2015-11-04 | Discharge: 2015-11-04 | Disposition: A | Discharge status: Home / Self Care | Payer: BLUE CROSS/BLUE SHIELD | Source: Ambulatory Visit | Attending: Family Medicine | Admitting: Family Medicine

## 2015-11-04 DIAGNOSIS — E042 Nontoxic multinodular goiter: Secondary | ICD-10-CM

## 2015-12-15 ENCOUNTER — Other Ambulatory Visit: Payer: Self-pay | Admitting: Family Medicine

## 2015-12-15 ENCOUNTER — Other Ambulatory Visit (HOSPITAL_COMMUNITY)
Admission: RE | Admit: 2015-12-15 | Discharge: 2015-12-15 | Disposition: A | Discharge status: Home / Self Care | Payer: BLUE CROSS/BLUE SHIELD | Source: Ambulatory Visit | Attending: Family Medicine | Admitting: Family Medicine

## 2015-12-15 DIAGNOSIS — Z1151 Encounter for screening for human papillomavirus (HPV): Secondary | ICD-10-CM | POA: Insufficient documentation

## 2015-12-15 DIAGNOSIS — L1230 Acquired epidermolysis bullosa, unspecified: Secondary | ICD-10-CM | POA: Insufficient documentation

## 2015-12-15 DIAGNOSIS — Z124 Encounter for screening for malignant neoplasm of cervix: Secondary | ICD-10-CM | POA: Insufficient documentation

## 2015-12-15 DIAGNOSIS — R002 Palpitations: Secondary | ICD-10-CM | POA: Insufficient documentation

## 2015-12-16 ENCOUNTER — Other Ambulatory Visit: Payer: Self-pay

## 2015-12-16 DIAGNOSIS — Z1231 Encounter for screening mammogram for malignant neoplasm of breast: Secondary | ICD-10-CM

## 2015-12-17 LAB — CYTOLOGY - PAP

## 2015-12-20 DIAGNOSIS — T8859XA Other complications of anesthesia, initial encounter: Secondary | ICD-10-CM

## 2015-12-20 HISTORY — DX: Other complications of anesthesia, initial encounter: T88.59XA

## 2015-12-25 ENCOUNTER — Ambulatory Visit: Payer: BLUE CROSS/BLUE SHIELD | Admitting: Cardiology

## 2015-12-29 DIAGNOSIS — B078 Other viral warts: Secondary | ICD-10-CM | POA: Diagnosis not present

## 2015-12-29 DIAGNOSIS — L918 Other hypertrophic disorders of the skin: Secondary | ICD-10-CM | POA: Diagnosis not present

## 2015-12-29 DIAGNOSIS — L820 Inflamed seborrheic keratosis: Secondary | ICD-10-CM | POA: Diagnosis not present

## 2016-01-02 ENCOUNTER — Ambulatory Visit
Admission: RE | Admit: 2016-01-02 | Discharge: 2016-01-02 | Disposition: A | Discharge status: Home / Self Care | Payer: BLUE CROSS/BLUE SHIELD | Source: Ambulatory Visit

## 2016-01-02 DIAGNOSIS — Z1231 Encounter for screening mammogram for malignant neoplasm of breast: Secondary | ICD-10-CM | POA: Diagnosis not present

## 2016-01-05 ENCOUNTER — Ambulatory Visit: Payer: BLUE CROSS/BLUE SHIELD | Admitting: Cardiology

## 2016-01-05 ENCOUNTER — Other Ambulatory Visit: Payer: Self-pay | Admitting: Family Medicine

## 2016-01-05 DIAGNOSIS — R928 Other abnormal and inconclusive findings on diagnostic imaging of breast: Secondary | ICD-10-CM

## 2016-01-07 ENCOUNTER — Ambulatory Visit (INDEPENDENT_AMBULATORY_CARE_PROVIDER_SITE_OTHER): Payer: BLUE CROSS/BLUE SHIELD | Admitting: Cardiology

## 2016-01-07 ENCOUNTER — Encounter: Payer: Self-pay | Admitting: Cardiology

## 2016-01-07 VITALS — BP 124/90 | HR 88 | Ht 62.0 in | Wt 254.2 lb

## 2016-01-07 DIAGNOSIS — Z01818 Encounter for other preprocedural examination: Secondary | ICD-10-CM | POA: Diagnosis not present

## 2016-01-07 NOTE — Patient Instructions (Addendum)
Medication Instructions No Change  Labwork: None Ordered  Procedures/Testing: None Ordered  Follow-Up: Your physician recommends that you schedule a follow-up appointment in 6 weeks with Dr. Elberta Fortisamnitz.  If you need a refill on your cardiac medications before your next appointment, please call your pharmacy.

## 2016-01-07 NOTE — Progress Notes (Signed)
Cardiology Office Note   Date:  01/07/2016   ID:  [name], DOB 08/14/1961, MRN [id number]  PCP:  Clayborn Heron, MD  Cardiologist:   Regan Lemming, MD    No chief complaint on file.    History of Present Illness: Kathryn [name] is a 55 y.o. female who presents today for cardiology evaluation.   She presents for evaluation of palpitations and the need for cardiac clearance prior to shoulder surgery. She does have a history of hypertension. She denies chest pain but does have palpitations. The palpitations are not associated with chest pain shortness of breath dizziness or syncope. They can go one daily, but then stopped for a week. She feels that her heart skips and this can last for several hours. She is also being evaluated for sleep apnea, and is considering bariatric surgery in the future.  She says that she does not have any chest pain, shortness of breath, PND, orthopnea. She is not limited when she walks on flat ground. She does say that she feels like she could climb one flight of stairs 12 would be short of breath at the top. She feels like the shortness of breath at the top is possibly related to her weight. She sleeps on 2-3 pillows at night but that is due to back pain. She does say that she feels like she can lie flat without any issues if necessary.  She does get palpitations. She describes the palpitations as feeling like skipped beats. She says that there are no exacerbating or alleviating factors. At times her palpitations can last for days. She has not had palpitations for quite a while more recently.  Today, she denies symptoms of palpitations, chest pain, shortness of breath, orthopnea, PND, lower extremity edema, claudication, dizziness, presyncope, syncope, bleeding, or neurologic sequela. The patient is tolerating medications without difficulties and is otherwise without complaint today.    Past Medical History  Diagnosis Date  . Hypertension   .  Overactive bladder   . Goiter   . Palpitations   . OSA (obstructive sleep apnea)   . Accessory skin tags   . Overactive bladder   . Thyroid nodule    Past Surgical History  Procedure Laterality Date  . Cardiac catheterization    . Shoulder surgery Right 1997  . Btl  1983  . Cholecystectomy, laparoscopic  05/2002  . 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 tha empties into the bladder     Current Outpatient Prescriptions  Medication Sig Dispense Refill  . valsartan-hydrochlorothiazide (DIOVAN HCT) 160-25 MG per tablet Take 1 tablet by mouth daily. 30 tablet 11   No current facility-administered medications for this visit.    Allergies:   Penicillins   Social History:  The patient  reports that she has never smoked. She has never used smokeless tobacco. She reports that she does not drink alcohol or use illicit drugs.   Family History:  The patient's family history includes COPD in her father; Cancer - Colon in her father; Colon polyps in her maternal grandmother; Coronary artery disease in her sister; Fibroids in her sister; Gallbladder disease in her maternal grandfather and mother; Leukemia in her brother and sister; Other in her mother.    ROS:  Please see the history of present illness.   Otherwise, review of systems is positive for none.   All other systems are reviewed and negative.  PHYSICAL EXAM: VS:  There were no vitals taken for this visit. , BMI There is no weight on file to calculate BMI. GEN: Well nourished, well developed, in no acute distress HEENT: normal Neck: no JVD, carotid bruits, or masses Cardiac: RRR; no murmurs, rubs, or gallops,no edema  Respiratory:  clear to auscultation bilaterally, normal work of breathing GI: soft, nontender, nondistended, + BS MS: no deformity or atrophy Skin: warm and dry Neuro:  Strength and sensation are intact Psych: euthymic mood, full affect  EKG:  EKG is ordered  today. The ekg ordered today shows sinus rhythm, 1 degree AV block, PR 22, rate 88  Recent Labs: No results found for requested labs within last 365 days.    Lipid Panel  No results found for: CHOL, TRIG, HDL, CHOLHDL, VLDL, LDLCALC, LDLDIRECT   Wt Readings from Last 3 Encounters:  11/14/14 239 lb (108.41 kg)  10/23/14 238 lb (107.956 kg)      Other studies Reviewed: Additional studies/ records that were reviewed today include: PCP notes  ASSESSMENT AND PLAN:  1.  Pre surgical evaluation:  Plan for shoulder surgery. She endorses no cardiac symptoms, including chest pain, shortness of breath, PND, or orthopnea. She can walk on flat ground without limitation, but does get short of breath when climbing a flight of stairs. This is likely due to her weight. Due to the fact that she has no symptoms, she would be at intermediate risk , due to her weight, for an intermediate risk procedure. At this time there are no medication changes that are needed.  2. Palpitations:Unclear as to the cause of her palpitations. I Carolan Avedisian see her back after her operation for further discussion and possible that monitor at that time.     Current medicines are reviewed at length with the patient today.   The patient does not have concerns regarding her medicines.  The following changes were made today:  none  Labs/ tests ordered today include:  No orders of the defined types were placed in this encounter.     Disposition:   FU with Shakim Faith 6 weeks  Signed, Tyshaun Vinzant Jorja LoaMartin Mahin Guardia, MD  01/07/2016 9:15 AM     [facility] [address] [place] [address] [phone] (office) [phone] (fax)

## 2016-01-08 DIAGNOSIS — G8918 Other acute postprocedural pain: Secondary | ICD-10-CM | POA: Diagnosis not present

## 2016-01-08 DIAGNOSIS — M7541 Impingement syndrome of right shoulder: Secondary | ICD-10-CM | POA: Diagnosis not present

## 2016-01-08 DIAGNOSIS — M19011 Primary osteoarthritis, right shoulder: Secondary | ICD-10-CM | POA: Diagnosis not present

## 2016-01-08 DIAGNOSIS — M75111 Incomplete rotator cuff tear or rupture of right shoulder, not specified as traumatic: Secondary | ICD-10-CM | POA: Diagnosis not present

## 2016-01-15 ENCOUNTER — Other Ambulatory Visit: Payer: BLUE CROSS/BLUE SHIELD

## 2016-01-19 ENCOUNTER — Ambulatory Visit
Admission: RE | Admit: 2016-01-19 | Discharge: 2016-01-19 | Disposition: A | Discharge status: Home / Self Care | Payer: BLUE CROSS/BLUE SHIELD | Source: Ambulatory Visit | Attending: Family Medicine | Admitting: Family Medicine

## 2016-01-19 DIAGNOSIS — R928 Other abnormal and inconclusive findings on diagnostic imaging of breast: Secondary | ICD-10-CM | POA: Diagnosis not present

## 2016-01-19 DIAGNOSIS — M19011 Primary osteoarthritis, right shoulder: Secondary | ICD-10-CM | POA: Diagnosis not present

## 2016-02-04 DIAGNOSIS — M19011 Primary osteoarthritis, right shoulder: Secondary | ICD-10-CM | POA: Diagnosis not present

## 2016-02-19 DIAGNOSIS — K219 Gastro-esophageal reflux disease without esophagitis: Secondary | ICD-10-CM | POA: Diagnosis not present

## 2016-02-19 DIAGNOSIS — Z7189 Other specified counseling: Secondary | ICD-10-CM | POA: Diagnosis not present

## 2016-02-19 DIAGNOSIS — I10 Essential (primary) hypertension: Secondary | ICD-10-CM | POA: Diagnosis not present

## 2016-02-19 DIAGNOSIS — Z6841 Body Mass Index (BMI) 40.0 and over, adult: Secondary | ICD-10-CM | POA: Diagnosis not present

## 2016-02-19 DIAGNOSIS — Z01818 Encounter for other preprocedural examination: Secondary | ICD-10-CM | POA: Diagnosis not present

## 2016-02-23 NOTE — Progress Notes (Addendum)
Cardiology Office Note   Date:  02/25/2016   ID:  Kathryn LippsGail L Ruane, DOB 02/26/1961, MRN [id number]  PCP:  Clayborn HeronVictoria R Rankins, MD  Cardiologist:   Regan LemmingWill Martin Anquinette Pierro, MD    Chief Complaint  Patient presents with  . Follow-up  . Palpitations     History of Present Illness: Kathryn [name] is a 55 y.o. female who presents today for cardiology evaluation.   She presents for evaluation of palpitations. She does have a history of hypertension. She denies chest pain but does have palpitations. The palpitations are not associated with chest pain shortness of breath dizziness or syncope. They can go one daily, but then stopped for a week. She feels that her heart skips and this can last for several hours. She is also being evaluated for sleep apnea, and is considering bariatric surgery in the future.    She has not had palpitations since last being seen.  She has felt well without issues.  She still has shoulder pain, but is going to rehab.  She says that she did have some complications around the time of her surgery that was associated with the nerve block. 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 [facility] is being [facility] at [facility] for [facility]up for potential bariatric surgery.  Today, she denies symptoms of palpitations, chest pain, shortness of breath, orthopnea, PND, lower extremity edema, claudication, dizziness, presyncope, syncope, bleeding, or neurologic sequela. The patient is tolerating medications without difficulties and is otherwise without complaint today.    Past Medical History  Diagnosis Date  . Hypertension   . Overactive bladder   . Goiter   . Palpitations   . OSA (obstructive sleep apnea)   . Accessory skin tags   . Overactive bladder   . Thyroid nodule    Past Surgical History  Procedure Laterality Date  . Cardiac catheterization    . Shoulder surgery Right 1997  . Btl  1983  .  Cholecystectomy, laparoscopic  05/2002  . 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 tha empties into the bladder     Current Outpatient Prescriptions  Medication Sig Dispense Refill  . Cholecalciferol (VITAMIN D3) 5000 units CAPS Take 1 capsule by mouth daily.    Marland Kitchen. HYDROcodone-acetaminophen (NORCO/VICODIN) 5-325 MG tablet Take 1 tablet by mouth every 6 (six) hours as needed.    . naproxen (NAPROSYN) 500 MG tablet Take 500 mg by mouth 2 (two) times daily.    . valsartan-hydrochlorothiazide (DIOVAN HCT) 160-25 MG per tablet Take 1 tablet by mouth daily. 30 tablet 11   No current facility-administered medications for this visit.    Allergies:   Iodinated diagnostic agents and Penicillins   Social History:  The patient  reports that she has never smoked. She has never used smokeless tobacco. She reports that she does not drink alcohol or use illicit drugs.   Family History:  The patient's family history includes COPD in her father; Cancer - Colon in her father; Colon polyps in her maternal grandmother; Coronary artery disease in her sister; Fibroids in her sister; Gallbladder disease in her maternal grandfather and mother; Leukemia in her brother and sister; Other in her mother.    ROS:  Please see the history of present illness.   Otherwise, review of systems is positive for Shoulder pain.   All other systems are reviewed and  negative.    PHYSICAL EXAM: VS:  BP 136/92 mmHg  Pulse 82  Ht  (1.575 m)  Wt 256 lb 3.2 oz (116.212 kg)  BMI 46.85 kg/m2 , BMI Body mass index is 46.85 kg/(m^2). GEN: Well nourished, well developed, in no acute distress HEENT: normal Neck: no JVD, carotid bruits, or masses Cardiac: RRR; no murmurs, rubs, or gallops,no edema  Respiratory:  clear to auscultation bilaterally, normal work of breathing GI: soft, nontender, nondistended, + BS MS: no deformity or atrophy Skin: warm and dry Neuro:  Strength and  sensation are intact Psych: euthymic mood, full affect  EKG:  EKG is not ordered today.   Recent Labs: No results found for requested labs within last 365 days.    Lipid Panel  No results found for: CHOL, TRIG, HDL, CHOLHDL, VLDL, LDLCALC, LDLDIRECT   Wt Readings from Last 3 Encounters:  02/25/16 256 lb 3.2 oz (116.212 kg)  01/07/16 254 lb 3.2 oz (115.304 kg)  11/14/14 239 lb (108.41 kg)      Other studies Reviewed: Additional studies/ records that were reviewed today include: PCP notes  ASSESSMENT AND PLAN:  1. Palpitations: Currently no evidence of palpitations since she was last seen. I told her to call the clinic back if she has further episodes of palpitations and we would be happy to see her then. She did have an EKG done at [facility], with a report saying that her QTC was 481. She does have a sister that is being treated for some sort of arrhythmia. I have asked her to call back with the sister's diagnosis. Potential workup from there.    Current medicines are reviewed at length with the patient today.   The patient does not have concerns regarding her medicines.  The following changes were made today:    Labs/ tests ordered today include:  No orders of the defined types were placed in this encounter.     Disposition:   FU with Ronzell Laban PRN Signed, Bland Rudzinski Jorja Loa, MD  02/25/2016 8:51 AM     [facility] [address] [place] [address] [phone] (office) [phone] (fax)

## 2016-02-25 ENCOUNTER — Encounter: Payer: Self-pay | Admitting: Cardiology

## 2016-02-25 ENCOUNTER — Ambulatory Visit (INDEPENDENT_AMBULATORY_CARE_PROVIDER_SITE_OTHER): Payer: BLUE CROSS/BLUE SHIELD | Admitting: Cardiology

## 2016-02-25 VITALS — BP 136/92 | HR 82 | Ht 62.0 in | Wt 256.2 lb

## 2016-02-25 DIAGNOSIS — R002 Palpitations: Secondary | ICD-10-CM

## 2016-02-25 NOTE — Patient Instructions (Signed)
Medication Instructions:  Your physician recommends that you continue on your current medications as directed. Please refer to the Current Medication list given to you today.  Labwork: None ordered  Testing/Procedures: None ordered  Follow-Up: No follow up is needed at this time with Dr. Elberta Fortisamnitz.  He will see you on an as needed basis.  Please call us once you have determined your sister's heart rhythm abnormality to determine if we need to follow you for long QT family history.  Thank you for choosing [facility]!!   Dory HornSherri Jerita Wimbush, RN [phone]

## 2016-03-03 DIAGNOSIS — Z9889 Other specified postprocedural states: Secondary | ICD-10-CM | POA: Diagnosis not present

## 2016-03-15 DIAGNOSIS — I10 Essential (primary) hypertension: Secondary | ICD-10-CM | POA: Diagnosis not present

## 2016-03-15 DIAGNOSIS — R0609 Other forms of dyspnea: Secondary | ICD-10-CM | POA: Diagnosis not present

## 2016-03-17 DIAGNOSIS — I10 Essential (primary) hypertension: Secondary | ICD-10-CM | POA: Diagnosis not present

## 2016-03-31 DIAGNOSIS — F54 Psychological and behavioral factors associated with disorders or diseases classified elsewhere: Secondary | ICD-10-CM | POA: Diagnosis not present

## 2016-03-31 DIAGNOSIS — Z6841 Body Mass Index (BMI) 40.0 and over, adult: Secondary | ICD-10-CM | POA: Diagnosis not present

## 2016-03-31 DIAGNOSIS — Z79899 Other long term (current) drug therapy: Secondary | ICD-10-CM | POA: Diagnosis not present

## 2016-04-05 DIAGNOSIS — L821 Other seborrheic keratosis: Secondary | ICD-10-CM | POA: Diagnosis not present

## 2016-04-05 DIAGNOSIS — L918 Other hypertrophic disorders of the skin: Secondary | ICD-10-CM | POA: Diagnosis not present

## 2016-04-07 DIAGNOSIS — M19011 Primary osteoarthritis, right shoulder: Secondary | ICD-10-CM | POA: Diagnosis not present

## 2016-04-09 DIAGNOSIS — K2970 Gastritis, unspecified, without bleeding: Secondary | ICD-10-CM | POA: Diagnosis not present

## 2016-04-09 DIAGNOSIS — K219 Gastro-esophageal reflux disease without esophagitis: Secondary | ICD-10-CM | POA: Diagnosis not present

## 2016-04-09 DIAGNOSIS — B9681 Helicobacter pylori [H. pylori] as the cause of diseases classified elsewhere: Secondary | ICD-10-CM | POA: Diagnosis not present

## 2016-04-09 DIAGNOSIS — Z1211 Encounter for screening for malignant neoplasm of colon: Secondary | ICD-10-CM | POA: Diagnosis not present

## 2016-04-09 DIAGNOSIS — K2950 Unspecified chronic gastritis without bleeding: Secondary | ICD-10-CM | POA: Diagnosis not present

## 2016-04-09 DIAGNOSIS — Z6841 Body Mass Index (BMI) 40.0 and over, adult: Secondary | ICD-10-CM | POA: Diagnosis not present

## 2016-04-11 DIAGNOSIS — K219 Gastro-esophageal reflux disease without esophagitis: Secondary | ICD-10-CM | POA: Diagnosis not present

## 2016-04-11 DIAGNOSIS — Z1211 Encounter for screening for malignant neoplasm of colon: Secondary | ICD-10-CM | POA: Diagnosis not present

## 2016-05-21 DIAGNOSIS — Z6841 Body Mass Index (BMI) 40.0 and over, adult: Secondary | ICD-10-CM | POA: Diagnosis not present

## 2016-06-21 DIAGNOSIS — M19011 Primary osteoarthritis, right shoulder: Secondary | ICD-10-CM | POA: Diagnosis not present

## 2016-06-24 DIAGNOSIS — K2970 Gastritis, unspecified, without bleeding: Secondary | ICD-10-CM | POA: Diagnosis not present

## 2016-06-24 DIAGNOSIS — K449 Diaphragmatic hernia without obstruction or gangrene: Secondary | ICD-10-CM | POA: Diagnosis not present

## 2016-06-24 DIAGNOSIS — Z01818 Encounter for other preprocedural examination: Secondary | ICD-10-CM | POA: Diagnosis not present

## 2016-06-24 DIAGNOSIS — I10 Essential (primary) hypertension: Secondary | ICD-10-CM | POA: Diagnosis not present

## 2016-06-24 DIAGNOSIS — Z6841 Body Mass Index (BMI) 40.0 and over, adult: Secondary | ICD-10-CM | POA: Diagnosis not present

## 2016-06-24 DIAGNOSIS — Z713 Dietary counseling and surveillance: Secondary | ICD-10-CM | POA: Diagnosis not present

## 2016-06-24 DIAGNOSIS — Z87891 Personal history of nicotine dependence: Secondary | ICD-10-CM | POA: Diagnosis not present

## 2016-07-02 DIAGNOSIS — Z6841 Body Mass Index (BMI) 40.0 and over, adult: Secondary | ICD-10-CM | POA: Diagnosis not present

## 2016-07-02 DIAGNOSIS — K2950 Unspecified chronic gastritis without bleeding: Secondary | ICD-10-CM | POA: Diagnosis not present

## 2016-07-02 DIAGNOSIS — Z87891 Personal history of nicotine dependence: Secondary | ICD-10-CM | POA: Diagnosis not present

## 2016-07-02 DIAGNOSIS — R109 Unspecified abdominal pain: Secondary | ICD-10-CM | POA: Diagnosis not present

## 2016-07-02 DIAGNOSIS — I10 Essential (primary) hypertension: Secondary | ICD-10-CM | POA: Diagnosis not present

## 2016-07-02 DIAGNOSIS — K219 Gastro-esophageal reflux disease without esophagitis: Secondary | ICD-10-CM | POA: Diagnosis not present

## 2016-07-02 DIAGNOSIS — G8918 Other acute postprocedural pain: Secondary | ICD-10-CM | POA: Diagnosis not present

## 2016-07-02 HISTORY — PX: LAPAROSCOPIC GASTRIC SLEEVE RESECTION: SHX5895

## 2016-07-20 DIAGNOSIS — Z6841 Body Mass Index (BMI) 40.0 and over, adult: Secondary | ICD-10-CM | POA: Diagnosis not present

## 2016-07-20 DIAGNOSIS — Z48815 Encounter for surgical aftercare following surgery on the digestive system: Secondary | ICD-10-CM | POA: Diagnosis not present

## 2016-07-20 DIAGNOSIS — F54 Psychological and behavioral factors associated with disorders or diseases classified elsewhere: Secondary | ICD-10-CM | POA: Diagnosis not present

## 2016-07-20 DIAGNOSIS — Z9884 Bariatric surgery status: Secondary | ICD-10-CM | POA: Diagnosis not present

## 2016-08-27 DIAGNOSIS — L820 Inflamed seborrheic keratosis: Secondary | ICD-10-CM | POA: Diagnosis not present

## 2016-08-27 DIAGNOSIS — L918 Other hypertrophic disorders of the skin: Secondary | ICD-10-CM | POA: Diagnosis not present

## 2016-11-08 DIAGNOSIS — M67911 Unspecified disorder of synovium and tendon, right shoulder: Secondary | ICD-10-CM | POA: Diagnosis not present

## 2016-11-13 DIAGNOSIS — M25511 Pain in right shoulder: Secondary | ICD-10-CM | POA: Diagnosis not present

## 2016-11-22 DIAGNOSIS — M67911 Unspecified disorder of synovium and tendon, right shoulder: Secondary | ICD-10-CM | POA: Diagnosis not present

## 2016-11-25 DIAGNOSIS — M542 Cervicalgia: Secondary | ICD-10-CM | POA: Diagnosis not present

## 2016-12-06 DIAGNOSIS — M67911 Unspecified disorder of synovium and tendon, right shoulder: Secondary | ICD-10-CM | POA: Diagnosis not present

## 2016-12-13 DIAGNOSIS — M5412 Radiculopathy, cervical region: Secondary | ICD-10-CM | POA: Diagnosis not present

## 2016-12-15 DIAGNOSIS — Z13220 Encounter for screening for lipoid disorders: Secondary | ICD-10-CM | POA: Diagnosis not present

## 2016-12-15 DIAGNOSIS — Z0000 Encounter for general adult medical examination without abnormal findings: Secondary | ICD-10-CM | POA: Diagnosis not present

## 2016-12-15 DIAGNOSIS — Z131 Encounter for screening for diabetes mellitus: Secondary | ICD-10-CM | POA: Diagnosis not present

## 2016-12-23 DIAGNOSIS — M5412 Radiculopathy, cervical region: Secondary | ICD-10-CM | POA: Diagnosis not present

## 2017-01-10 DIAGNOSIS — M5412 Radiculopathy, cervical region: Secondary | ICD-10-CM | POA: Diagnosis not present

## 2017-01-21 DIAGNOSIS — E669 Obesity, unspecified: Secondary | ICD-10-CM | POA: Diagnosis not present

## 2017-01-21 DIAGNOSIS — Z6836 Body mass index (BMI) 36.0-36.9, adult: Secondary | ICD-10-CM | POA: Diagnosis not present

## 2017-01-21 DIAGNOSIS — K912 Postsurgical malabsorption, not elsewhere classified: Secondary | ICD-10-CM | POA: Diagnosis not present

## 2017-01-21 DIAGNOSIS — Z48815 Encounter for surgical aftercare following surgery on the digestive system: Secondary | ICD-10-CM | POA: Diagnosis not present

## 2017-01-21 DIAGNOSIS — Z87891 Personal history of nicotine dependence: Secondary | ICD-10-CM | POA: Diagnosis not present

## 2017-01-21 DIAGNOSIS — Z9884 Bariatric surgery status: Secondary | ICD-10-CM | POA: Diagnosis not present

## 2017-01-21 DIAGNOSIS — Z713 Dietary counseling and surveillance: Secondary | ICD-10-CM | POA: Diagnosis not present

## 2017-01-24 DIAGNOSIS — M5412 Radiculopathy, cervical region: Secondary | ICD-10-CM | POA: Diagnosis not present

## 2017-02-04 DIAGNOSIS — M5412 Radiculopathy, cervical region: Secondary | ICD-10-CM | POA: Diagnosis not present

## 2017-02-23 DIAGNOSIS — M5412 Radiculopathy, cervical region: Secondary | ICD-10-CM | POA: Diagnosis not present

## 2017-03-01 DIAGNOSIS — M5412 Radiculopathy, cervical region: Secondary | ICD-10-CM | POA: Diagnosis not present

## 2017-03-17 ENCOUNTER — Other Ambulatory Visit: Payer: Self-pay | Admitting: Orthopedic Surgery

## 2017-03-29 ENCOUNTER — Other Ambulatory Visit: Payer: Self-pay | Admitting: Orthopedic Surgery

## 2017-03-29 ENCOUNTER — Encounter (HOSPITAL_COMMUNITY): Payer: Self-pay

## 2017-03-29 ENCOUNTER — Encounter (HOSPITAL_COMMUNITY)
Admission: RE | Admit: 2017-03-29 | Discharge: 2017-03-29 | Disposition: A | Discharge status: Home / Self Care | Payer: BLUE CROSS/BLUE SHIELD | Source: Ambulatory Visit | Attending: Orthopedic Surgery | Admitting: Orthopedic Surgery

## 2017-03-29 DIAGNOSIS — I440 Atrioventricular block, first degree: Secondary | ICD-10-CM | POA: Insufficient documentation

## 2017-03-29 DIAGNOSIS — Z9884 Bariatric surgery status: Secondary | ICD-10-CM | POA: Insufficient documentation

## 2017-03-29 DIAGNOSIS — M5412 Radiculopathy, cervical region: Secondary | ICD-10-CM | POA: Diagnosis not present

## 2017-03-29 DIAGNOSIS — Z01810 Encounter for preprocedural cardiovascular examination: Secondary | ICD-10-CM | POA: Insufficient documentation

## 2017-03-29 DIAGNOSIS — I517 Cardiomegaly: Secondary | ICD-10-CM | POA: Diagnosis not present

## 2017-03-29 DIAGNOSIS — E042 Nontoxic multinodular goiter: Secondary | ICD-10-CM | POA: Insufficient documentation

## 2017-03-29 DIAGNOSIS — Z01812 Encounter for preprocedural laboratory examination: Secondary | ICD-10-CM | POA: Insufficient documentation

## 2017-03-29 DIAGNOSIS — I10 Essential (primary) hypertension: Secondary | ICD-10-CM | POA: Insufficient documentation

## 2017-03-29 DIAGNOSIS — G4733 Obstructive sleep apnea (adult) (pediatric): Secondary | ICD-10-CM | POA: Insufficient documentation

## 2017-03-29 DIAGNOSIS — K219 Gastro-esophageal reflux disease without esophagitis: Secondary | ICD-10-CM | POA: Diagnosis not present

## 2017-03-29 HISTORY — DX: Headache, unspecified: R51.9

## 2017-03-29 HISTORY — DX: Unspecified osteoarthritis, unspecified site: M19.90

## 2017-03-29 HISTORY — DX: Personal history of urinary calculi: Z87.442

## 2017-03-29 HISTORY — DX: Other specified postprocedural states: R11.2

## 2017-03-29 HISTORY — DX: Other specified postprocedural states: Z98.890

## 2017-03-29 HISTORY — DX: Pneumonia, unspecified organism: J18.9

## 2017-03-29 HISTORY — DX: Headache: R51

## 2017-03-29 HISTORY — DX: Gastro-esophageal reflux disease without esophagitis: K21.9

## 2017-03-29 HISTORY — DX: Adverse effect of unspecified anesthetic, initial encounter: T41.45XA

## 2017-03-29 LAB — CBC WITH DIFFERENTIAL/PLATELET
BASOS ABS: 0 10*3/uL (ref 0.0–0.1)
Basophils Relative: 0 %
EOS ABS: 0.1 10*3/uL (ref 0.0–0.7)
EOS PCT: 1 %
HCT: 43.4 % (ref 36.0–46.0)
Hemoglobin: 14.1 g/dL (ref 12.0–15.0)
Lymphocytes Relative: 32 %
Lymphs Abs: 2.7 10*3/uL (ref 0.7–4.0)
MCH: 28.7 pg (ref 26.0–34.0)
MCHC: 32.5 g/dL (ref 30.0–36.0)
MCV: 88.4 fL (ref 78.0–100.0)
MONO ABS: 0.5 10*3/uL (ref 0.1–1.0)
MONOS PCT: 6 %
Neutro Abs: 5.3 10*3/uL (ref 1.7–7.7)
Neutrophils Relative %: 61 %
PLATELETS: 220 10*3/uL (ref 150–400)
RBC: 4.91 MIL/uL (ref 3.87–5.11)
RDW: 13.5 % (ref 11.5–15.5)
WBC: 8.7 10*3/uL (ref 4.0–10.5)

## 2017-03-29 LAB — PROTIME-INR
INR: 0.98
Prothrombin Time: 12.9 seconds (ref 11.4–15.2)

## 2017-03-29 LAB — COMPREHENSIVE METABOLIC PANEL
ALBUMIN: 4.2 g/dL (ref 3.5–5.0)
ALT: 18 U/L (ref 14–54)
ANION GAP: 9 (ref 5–15)
AST: 17 U/L (ref 15–41)
Alkaline Phosphatase: 100 U/L (ref 38–126)
BILIRUBIN TOTAL: 0.8 mg/dL (ref 0.3–1.2)
BUN: 17 mg/dL (ref 6–20)
CHLORIDE: 106 mmol/L (ref 101–111)
CO2: 25 mmol/L (ref 22–32)
Calcium: 9.3 mg/dL (ref 8.9–10.3)
Creatinine, Ser: 0.64 mg/dL (ref 0.44–1.00)
GFR calc Af Amer: >60 mL/min (ref >60)
GFR calc non Af Amer: >60 mL/min (ref >60)
GLUCOSE: 105 mg/dL — AB (ref 65–99)
POTASSIUM: 4 mmol/L (ref 3.5–5.1)
SODIUM: 140 mmol/L (ref 135–145)
TOTAL PROTEIN: 7.1 g/dL (ref 6.5–8.1)

## 2017-03-29 LAB — SURGICAL PCR SCREEN
MRSA, PCR: NEGATIVE
STAPHYLOCOCCUS AUREUS: NEGATIVE

## 2017-03-29 LAB — URINALYSIS, ROUTINE W REFLEX MICROSCOPIC
BILIRUBIN URINE: NEGATIVE
Glucose, UA: NEGATIVE mg/dL
Hgb urine dipstick: NEGATIVE
Ketones, ur: 5 mg/dL — AB
LEUKOCYTES UA: NEGATIVE
NITRITE: NEGATIVE
Protein, ur: NEGATIVE mg/dL
SPECIFIC GRAVITY, URINE: 1.024 (ref 1.005–1.030)
pH: 6 (ref 5.0–8.0)

## 2017-03-29 LAB — APTT: APTT: 29 s (ref 24–36)

## 2017-03-29 NOTE — Progress Notes (Signed)
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

## 2017-03-29 NOTE — Pre-Procedure Instructions (Signed)
Kathryn [name]  03/29/2017     Your procedure is scheduled on Thursday, July 19.  Report to [facility] Admitting at 5:30 AM                 Your surgery or procedure is scheduled for 7:30 PM   Call this number if you have problems the morning of surgery: [phone]                For any other questions, please call [phone], Monday - Friday 8 AM - 4 PM.    Remember:  Do not eat food or drink liquids after midnight Wednesday, July 18.  Take these medicines the morning of surgery with A SIP OF WATER : Take if needed: HYDROcodone-acetaminophen (NORCO/VICODIN).                 1 Week prior to surgery STOP taking Aspirin, Aspirin Products (Goody Powder, Excedrin Migraine), Ibuprofen (Advil), Naproxen (Aleve), Vitimins and Herbal Products (ie Fish Oil) Special instructions:   - Preparing For Surgery  Before surgery, you can play an important role. Because skin is not sterile, your skin needs to be as free of germs as possible. You can reduce the number of germs on your skin by washing with CHG (chlorahexidine gluconate) Soap before surgery.  CHG is an antiseptic cleaner which kills germs and bonds with the skin to continue killing germs even after washing.  Please do not use if you have an allergy to CHG or antibacterial soaps. If your skin becomes reddened/irritated stop using the CHG.  Do not shave (including legs and underarms) for at least 48 hours prior to first CHG shower. It is OK to shave your face.  Please follow these instructions carefully.   1. Shower the NIGHT BEFORE SURGERY and the MORNING OF SURGERY with CHG.   2. If you chose to wash your hair, wash your hair first as usual with your normal shampoo.  3. After you shampoo, rinse your hair and body thoroughly to remove the shampoo.  Wash your face and private area with your regular soap, then rinse.  4. Use CHG as you would any other liquid soap. You can apply CHG directly to the skin and  wash gently with a scrungie or a clean washcloth.   5. Apply the CHG Soap to your body ONLY FROM THE NECK DOWN.  Do not use on open wounds or open sores. Avoid contact with your eyes, ears, mouth and genitals (private parts). Wash genitals (private parts) with your normal soap.  6. Wash thoroughly, paying special attention to the area where your surgery will be performed.  7. Thoroughly rinse your body with warm water from the neck down.  8. DO NOT shower/wash with your normal soap after using and rinsing off the CHG Soap.  9. Pat yourself dry with a CLEAN TOWEL.   10. Wear CLEAN PAJAMAS   11. Place CLEAN SHEETS on your bed the night of your first shower and DO NOT SLEEP WITH PETS.  Day of Surgery: Shower as above. Do not apply any deodorants/lotions. Please wear clean clothes to the hospital/surgery center.    Do not wear jewelry, make-up or nail polish.  Do not wear lotions, powders, or perfumes, or deoderant.  Do not shave 48 hours prior to surgery.  Men may shave face and neck.  Do not bring valuables to the hospital.  [facility] is not responsible for any belongings or valuables.  Contacts, dentures or bridgework may not be worn into surgery.  Leave your suitcase in the car.  After surgery it may be brought to your room.  For patients admitted to the hospital, discharge time will be determined by your treatment team.  Patients discharged the day of surgery will not be allowed to drive home.  Please read over the following fact sheets that you were given: Pain Booklet, Patient Instructions for Mupirocin Application, Incentive Spirometry, Surgical Site Infections.

## 2017-03-29 NOTE — Progress Notes (Signed)
Ms Benancio Deedsewsome denies chest pain or shortness of breath.  Patient is no longer on blood pressure medications, no longer has GERD since she had gastric sleeve surgery 06/2016.   Patient had cardiology work up prior to gastric surgery, I have [facility] tracing [facility]. 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

## 2017-03-30 ENCOUNTER — Other Ambulatory Visit: Payer: Self-pay | Admitting: Orthopedic Surgery

## 2017-03-30 MED ORDER — CHLORHEXIDINE GLUCONATE 4 % EX LIQD: Freq: Once | CUTANEOUS | Status: DC

## 2017-03-31 NOTE — Progress Notes (Signed)
Anesthesia chart review:  Patient is a 56 year old female scheduled for C5-6, C6-7 ACDF with instrumentation and allograft on 04/07/2017 with Estill BambergMark Dumonski, MD  PMH includes:  HTN (no meds since gastric bypass), palpitations, OSA, goiter, thyroid nodule, post-op N/V, GERD. Former smoker. BMI 35. 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  Anesthesia history: Patient [facility]. She reports having to stay overnight post-op, due to paralyzed R diaphragm. Anesthesia records reviewed, and do show patient received a R interscalene block for the surgery (on paper chart).  Medications reviewed.  BP (!) 143/68   Pulse 71   Temp 36.9 C (Oral)   Resp 18   Ht 5\' 2"  (1.575 m)   Wt 192 lb 14.4 oz (87.5 kg)   SpO2 100%   BMI 35.28 kg/m   Preoperative labs reviewed.  EKG 03/29/17: Sinus rhythm with 1st degree A-V block. LVH. Left atrial enlargement  Echo 03/09/16 (care everywhere): 1. Normal LV systolic function with mild LVH. 2. Normal LA pressure is with diastolic dysfunction. 3. Normal RV systolic function. 4. Trivial PR. 5. No valvular stenosis.  If no changes, I anticipate pt can proceed with surgery as scheduled.   Rica Mastngela Loren Vicens, FNP-BC [facility] Phone: [phone] 03/31/2017 12:54 PM

## 2017-04-01 DIAGNOSIS — K912 Postsurgical malabsorption, not elsewhere classified: Secondary | ICD-10-CM | POA: Diagnosis not present

## 2017-04-01 DIAGNOSIS — Z48815 Encounter for surgical aftercare following surgery on the digestive system: Secondary | ICD-10-CM | POA: Diagnosis not present

## 2017-04-01 DIAGNOSIS — Z9884 Bariatric surgery status: Secondary | ICD-10-CM | POA: Diagnosis not present

## 2017-04-01 DIAGNOSIS — Z6834 Body mass index (BMI) 34.0-34.9, adult: Secondary | ICD-10-CM | POA: Diagnosis not present

## 2017-04-01 DIAGNOSIS — Z713 Dietary counseling and surveillance: Secondary | ICD-10-CM | POA: Diagnosis not present

## 2017-04-06 MED ORDER — VANCOMYCIN HCL 10 G IV SOLR
1500.0000 mg | INTRAVENOUS | Status: AC
  Administered 2017-04-07: 1500 mg via INTRAVENOUS
  Filled 2017-04-06 (×2): qty 1500

## 2017-04-06 NOTE — Anesthesia Preprocedure Evaluation (Addendum)
Anesthesia Evaluation  Patient identified by MRN, date of birth, ID band Patient awake    Reviewed: Allergy & Precautions, NPO status , Patient's Chart, lab work & pertinent test results  History of Anesthesia Complications (+) PONV, PROLONGED EMERGENCE and history of anesthetic complications  Airway Mallampati: II  TM Distance: >3 FB Neck ROM: Full    Dental no notable dental hx. (+) Poor Dentition, Chipped, Dental Advisory Given   Pulmonary sleep apnea , former smoker,    Pulmonary exam normal breath sounds clear to auscultation       Cardiovascular hypertension, Normal cardiovascular exam Rhythm:Regular Rate:Normal     Neuro/Psych  Neuromuscular disease negative psych ROS   GI/Hepatic negative GI ROS, Neg liver ROS, GERD  Controlled,  Endo/Other  negative endocrine ROS  Renal/GU negative Renal ROS     Musculoskeletal negative musculoskeletal ROS (+)   Abdominal   Peds  Hematology negative hematology ROS (+)   Anesthesia Other Findings Day of surgery medications reviewed with the patient.  Reproductive/Obstetrics                           Anesthesia Physical Anesthesia Plan  ASA: II  Anesthesia Plan: General   Post-op Pain Management:    Induction: Intravenous  PONV Risk Score and Plan: 3 and Ondansetron, Dexamethasone and Scopolamine patch - Pre-op  Airway Management Planned: Oral ETT  Additional Equipment:   Intra-op Plan:   Post-operative Plan: Extubation in OR  Informed Consent: I have reviewed the patients History and Physical, chart, labs and discussed the procedure including the risks, benefits and alternatives for the proposed anesthesia with the patient or authorized representative who has indicated his/her understanding and acceptance.   Dental advisory given  Plan Discussed with: CRNA and Anesthesiologist  Anesthesia Plan Comments:        Anesthesia Quick  Evaluation

## 2017-04-06 NOTE — H&P (Signed)
PREOPERATIVE H&P  Chief Complaint: Right arm pain  HPI: Kathryn [name] is a 56 y.o. 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 Accessory skin tags   . Arthritis    lower back  . Complication of anesthesia 12/2015   block paralized right diaphram - sats dropped shoulder surgery   . GERD (gastroesophageal reflux disease)    No longer since gastric sleeve  . Goiter   . Headache    migraines as a teenager  . History of kidney stones     56 years old  . Hypertension    not current- normal since gastric sleeve  . OSA (obstructive sleep apnea)   . Overactive bladder   . Overactive bladder   . Palpitations   . Pneumonia    2005 ish, 2016  . PONV (postoperative nausea and vomiting)   . Thyroid nodule    Past Surgical History:  Procedure Laterality Date  . BTL  1983  . CARDIAC CATHETERIZATION    . CHOLECYSTECTOMY, LAPAROSCOPIC  05/2002  . COLONOSCOPY    . ESOPHAGOGASTRODUODENOSCOPY    . KIDNEY SURGERY     age 89- 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 tha empties into the bladder  . LAPAROSCOPIC GASTRIC SLEEVE RESECTION  07/02/2016  . shoulder surgery Right 1997    x 3 Arthroscopy  . TUBAL LIGATION     Social History   Social History  . Marital status: Divorced    Spouse name: N/A  . Number of children: N/A  . Years of education: N/A   Occupational History  . 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 Smoking status: Former Games developermoker  . Smokeless tobacco: Never Used     Comment: as a teenager- social a year or so  . Alcohol use No  . Drug use: No  . Sexual activity: Not on file   Other Topics Concern  . Not on file    Social History Narrative  . No narrative on file   Family History  Problem Relation Age of Onset  . COPD Father        smoked  . Cancer - Colon Father   . Other Mother        GASTRIC STAPLING  . Gallbladder disease Mother   . Gallbladder disease Maternal Grandfather        GASTRIC STAPLING  . Colon polyps Maternal Grandmother   . Cirrhosis Unknown   . Leukemia Brother        has one kidney  . Leukemia Sister   . Coronary artery disease Sister        X3  . Hypertension Unknown   . Migraines Unknown   . Fibroids Sister        3X   Allergies  Allergen Reactions  . Iodinated Diagnostic Agents Other (See Comments)    Unknown : childhood reaction   . Penicillins Hives    Has patient had a PCN reaction causing immediate rash, facial/tongue/throat swelling, SOB or lightheadedness with hypotension: No Has patient had a PCN reaction causing severe rash involving mucus membranes or skin necrosis: No Has patient  had a PCN reaction that required hospitalization: No Has patient had a PCN reaction occurring within the last 10 years: No If all of the above answers are "NO", then may proceed with Cephalosporin use.    Prior to Admission medications   Medication Sig Start Date End Date Taking? Authorizing Provider  BIOTIN PO Take 1 tablet by mouth daily.   Yes [provider]  Calcium Carb-Cholecalciferol (CALCIUM 500+D PO) Take 2 tablets by mouth daily. chews   Yes [provider]  cholecalciferol (VITAMIN D) 1000 units tablet Take 2,000 Units by mouth daily.   Yes [provider]  Cyanocobalamin (B-12) 1000 MCG SUBL Place 1,000 mcg under the tongue daily.   Yes [provider]  HYDROcodone-acetaminophen (NORCO/VICODIN) 5-325 MG tablet Take 1 tablet by mouth 2 (two) times daily as needed for moderate pain.    Yes [provider]  ibuprofen (ADVIL,MOTRIN) 200 MG tablet Take 400 mg by mouth 2 (two) times daily as needed for moderate pain.    Yes [provider]  Multiple Vitamin (MULTIVITAMIN WITH MINERALS) TABS tablet Take 1 tablet by mouth daily.   Yes [provider]  Polyvinyl Alcohol-Povidone (REFRESH OP) Apply 2 drops to eye daily.   Yes [provider]  valsartan-hydrochlorothiazide (DIOVAN HCT) 160-25 MG per tablet Take 1 tablet by mouth daily. Patient not taking: Reported on 03/24/2017 10/23/14   Nyoka Cowden, MD     All other systems have been reviewed and were otherwise negative with the exception of those mentioned in the HPI and as above.  Physical Exam: There were no vitals filed for this visit.  General: Alert, no acute distress Cardiovascular: No pedal edema Respiratory: No cyanosis, no use of accessory musculature Skin: No lesions in the area of chief complaint Neurologic: Sensation intact distally Psychiatric: Patient is competent for consent with normal mood and affect Lymphatic: No axillary or cervical lymphadenopathy   Assessment/Plan: Right arm pain Plan for Procedure(s): ANTERIOR CERVICAL DECOMPRESSION FUSION, CERVICAL 5-6, CERVICAL 6-7 WITH INSTRUMENTATION AND ALLOGRAFT   Emilee Hero, MD 04/06/2017 12:04 PM

## 2017-04-07 ENCOUNTER — Ambulatory Visit (HOSPITAL_COMMUNITY): Payer: BLUE CROSS/BLUE SHIELD | Admitting: Emergency Medicine

## 2017-04-07 ENCOUNTER — Observation Stay (HOSPITAL_COMMUNITY)
Admission: RE | Admit: 2017-04-07 | Discharge: 2017-04-08 | Disposition: A | Discharge status: Home / Self Care | Payer: BLUE CROSS/BLUE SHIELD | Source: Ambulatory Visit | Attending: Orthopedic Surgery | Admitting: Orthopedic Surgery

## 2017-04-07 ENCOUNTER — Observation Stay (HOSPITAL_COMMUNITY): Payer: BLUE CROSS/BLUE SHIELD

## 2017-04-07 ENCOUNTER — Encounter (HOSPITAL_COMMUNITY): Payer: Self-pay | Admitting: *Deleted

## 2017-04-07 ENCOUNTER — Ambulatory Visit (HOSPITAL_COMMUNITY): Payer: BLUE CROSS/BLUE SHIELD | Admitting: Anesthesiology

## 2017-04-07 ENCOUNTER — Encounter (HOSPITAL_COMMUNITY)
Admission: RE | Disposition: A | Discharge status: Home / Self Care | Payer: Self-pay | Source: Ambulatory Visit | Attending: Orthopedic Surgery

## 2017-04-07 DIAGNOSIS — M79601 Pain in right arm: Secondary | ICD-10-CM | POA: Diagnosis not present

## 2017-04-07 DIAGNOSIS — G9520 Unspecified cord compression: Secondary | ICD-10-CM | POA: Diagnosis not present

## 2017-04-07 DIAGNOSIS — Z8371 Family history of colonic polyps: Secondary | ICD-10-CM | POA: Diagnosis not present

## 2017-04-07 DIAGNOSIS — Z419 Encounter for procedure for purposes other than remedying health state, unspecified: Secondary | ICD-10-CM

## 2017-04-07 DIAGNOSIS — Z806 Family history of leukemia: Secondary | ICD-10-CM | POA: Diagnosis not present

## 2017-04-07 DIAGNOSIS — M9981 Other biomechanical lesions of cervical region: Secondary | ICD-10-CM | POA: Diagnosis not present

## 2017-04-07 DIAGNOSIS — Z800 Family history of malignant neoplasm of digestive organs: Secondary | ICD-10-CM | POA: Insufficient documentation

## 2017-04-07 DIAGNOSIS — M5412 Radiculopathy, cervical region: Principal | ICD-10-CM | POA: Insufficient documentation

## 2017-04-07 DIAGNOSIS — G9529 Other cord compression: Secondary | ICD-10-CM | POA: Diagnosis not present

## 2017-04-07 DIAGNOSIS — Z87891 Personal history of nicotine dependence: Secondary | ICD-10-CM | POA: Diagnosis not present

## 2017-04-07 DIAGNOSIS — M1990 Unspecified osteoarthritis, unspecified site: Secondary | ICD-10-CM | POA: Diagnosis not present

## 2017-04-07 DIAGNOSIS — Z8249 Family history of ischemic heart disease and other diseases of the circulatory system: Secondary | ICD-10-CM | POA: Diagnosis not present

## 2017-04-07 DIAGNOSIS — Z836 Family history of other diseases of the respiratory system: Secondary | ICD-10-CM | POA: Diagnosis not present

## 2017-04-07 DIAGNOSIS — M5410 Radiculopathy, site unspecified: Secondary | ICD-10-CM | POA: Diagnosis present

## 2017-04-07 DIAGNOSIS — Z79899 Other long term (current) drug therapy: Secondary | ICD-10-CM | POA: Insufficient documentation

## 2017-04-07 DIAGNOSIS — M4802 Spinal stenosis, cervical region: Secondary | ICD-10-CM | POA: Diagnosis not present

## 2017-04-07 DIAGNOSIS — G4733 Obstructive sleep apnea (adult) (pediatric): Secondary | ICD-10-CM | POA: Insufficient documentation

## 2017-04-07 DIAGNOSIS — K219 Gastro-esophageal reflux disease without esophagitis: Secondary | ICD-10-CM | POA: Diagnosis not present

## 2017-04-07 DIAGNOSIS — M4322 Fusion of spine, cervical region: Secondary | ICD-10-CM | POA: Diagnosis not present

## 2017-04-07 HISTORY — PX: ANTERIOR CERVICAL DECOMP/DISCECTOMY FUSION: SHX1161

## 2017-04-07 SURGERY — ANTERIOR CERVICAL DECOMPRESSION/DISCECTOMY FUSION 2 LEVELS
Anesthesia: General | Site: Neck | Laterality: Right

## 2017-04-07 MED ORDER — MENTHOL 3 MG MT LOZG
1.0000 | LOZENGE | OROMUCOSAL | Status: DC | PRN
  Filled 2017-04-07: qty 9

## 2017-04-07 MED ORDER — ACETAMINOPHEN 325 MG PO TABS: 650.0000 mg | ORAL_TABLET | ORAL | Status: DC | PRN

## 2017-04-07 MED ORDER — DEXAMETHASONE SODIUM PHOSPHATE 10 MG/ML IJ SOLN
INTRAMUSCULAR | Status: DC | PRN
  Administered 2017-04-07: 10 mg via INTRAVENOUS

## 2017-04-07 MED ORDER — PANTOPRAZOLE SODIUM 40 MG PO TBEC
40.0000 mg | DELAYED_RELEASE_TABLET | Freq: Every day | ORAL | Status: DC
  Administered 2017-04-07: 40 mg via ORAL
  Filled 2017-04-07: qty 1

## 2017-04-07 MED ORDER — MORPHINE SULFATE (PF) 4 MG/ML IV SOLN: 1.0000 mg | INTRAVENOUS | Status: DC | PRN

## 2017-04-07 MED ORDER — HYDROMORPHONE HCL 1 MG/ML IJ SOLN
INTRAMUSCULAR | Status: AC
Start: 2017-04-07 — End: 2017-04-07
  Filled 2017-04-07: qty 0.5

## 2017-04-07 MED ORDER — ROCURONIUM BROMIDE 50 MG/5ML IV SOLN
INTRAVENOUS | Status: AC
  Filled 2017-04-07: qty 1

## 2017-04-07 MED ORDER — SODIUM CHLORIDE 0.9 % IV SOLN: 250.0000 mL | INTRAVENOUS | Status: DC

## 2017-04-07 MED ORDER — PROPOFOL 10 MG/ML IV BOLUS
INTRAVENOUS | Status: AC
  Filled 2017-04-07: qty 20

## 2017-04-07 MED ORDER — DEXAMETHASONE SODIUM PHOSPHATE 10 MG/ML IJ SOLN
INTRAMUSCULAR | Status: AC
  Filled 2017-04-07: qty 1

## 2017-04-07 MED ORDER — SENNOSIDES-DOCUSATE SODIUM 8.6-50 MG PO TABS: 1.0000 | ORAL_TABLET | Freq: Every evening | ORAL | Status: DC | PRN

## 2017-04-07 MED ORDER — EPHEDRINE SULFATE 50 MG/ML IJ SOLN
INTRAMUSCULAR | Status: DC | PRN
  Administered 2017-04-07 (×2): 5 mg via INTRAVENOUS
  Administered 2017-04-07: 10 mg via INTRAVENOUS

## 2017-04-07 MED ORDER — EPHEDRINE 5 MG/ML INJ
INTRAVENOUS | Status: AC
  Filled 2017-04-07: qty 10

## 2017-04-07 MED ORDER — SODIUM CHLORIDE 0.9% FLUSH: 3.0000 mL | INTRAVENOUS | Status: DC | PRN

## 2017-04-07 MED ORDER — MIDAZOLAM HCL 2 MG/2ML IJ SOLN
INTRAMUSCULAR | Status: AC
  Filled 2017-04-07: qty 2

## 2017-04-07 MED ORDER — PANTOPRAZOLE SODIUM 40 MG IV SOLR: 40.0000 mg | Freq: Every day | INTRAVENOUS | Status: DC

## 2017-04-07 MED ORDER — ALUM & MAG HYDROXIDE-SIMETH 200-200-20 MG/5ML PO SUSP: 30.0000 mL | Freq: Four times a day (QID) | ORAL | Status: DC | PRN

## 2017-04-07 MED ORDER — ROCURONIUM BROMIDE 100 MG/10ML IV SOLN
INTRAVENOUS | Status: DC | PRN
  Administered 2017-04-07: 50 mg via INTRAVENOUS

## 2017-04-07 MED ORDER — BUPIVACAINE-EPINEPHRINE 0.25% -1:200000 IJ SOLN
INTRAMUSCULAR | Status: DC | PRN
  Administered 2017-04-07: 30 mL

## 2017-04-07 MED ORDER — ONDANSETRON HCL 4 MG/2ML IJ SOLN: 4.0000 mg | Freq: Four times a day (QID) | INTRAMUSCULAR | Status: DC | PRN

## 2017-04-07 MED ORDER — SCOPOLAMINE 1 MG/3DAYS TD PT72
1.0000 | MEDICATED_PATCH | TRANSDERMAL | Status: DC
  Administered 2017-04-07: 1.5 mg via TRANSDERMAL
  Filled 2017-04-07: qty 1

## 2017-04-07 MED ORDER — FLEET ENEMA 7-19 GM/118ML RE ENEM: 1.0000 | ENEMA | Freq: Once | RECTAL | Status: DC | PRN

## 2017-04-07 MED ORDER — PROPOFOL 10 MG/ML IV BOLUS
INTRAVENOUS | Status: DC | PRN
  Administered 2017-04-07: 160 mg via INTRAVENOUS
  Administered 2017-04-07: 40 mg via INTRAVENOUS

## 2017-04-07 MED ORDER — ONDANSETRON HCL 4 MG/2ML IJ SOLN
INTRAMUSCULAR | Status: DC | PRN
  Administered 2017-04-07: 4 mg via INTRAVENOUS

## 2017-04-07 MED ORDER — ACETAMINOPHEN 650 MG RE SUPP: 650.0000 mg | RECTAL | Status: DC | PRN

## 2017-04-07 MED ORDER — SUGAMMADEX SODIUM 200 MG/2ML IV SOLN
INTRAVENOUS | Status: DC | PRN
  Administered 2017-04-07: 175 mg via INTRAVENOUS

## 2017-04-07 MED ORDER — SODIUM CHLORIDE 0.9% FLUSH
3.0000 mL | Freq: Two times a day (BID) | INTRAVENOUS | Status: DC
  Administered 2017-04-07: 3 mL via INTRAVENOUS

## 2017-04-07 MED ORDER — PROMETHAZINE HCL 25 MG/ML IJ SOLN: 6.2500 mg | INTRAMUSCULAR | Status: DC | PRN

## 2017-04-07 MED ORDER — BUPIVACAINE-EPINEPHRINE 0.25% -1:200000 IJ SOLN
INTRAMUSCULAR | Status: AC
  Filled 2017-04-07: qty 1

## 2017-04-07 MED ORDER — BISACODYL 5 MG PO TBEC: 5.0000 mg | DELAYED_RELEASE_TABLET | Freq: Every day | ORAL | Status: DC | PRN

## 2017-04-07 MED ORDER — ONDANSETRON HCL 4 MG/2ML IJ SOLN
INTRAMUSCULAR | Status: AC
  Filled 2017-04-07: qty 2

## 2017-04-07 MED ORDER — DOCUSATE SODIUM 100 MG PO CAPS
100.0000 mg | ORAL_CAPSULE | Freq: Two times a day (BID) | ORAL | Status: DC
  Administered 2017-04-07 – 2017-04-08 (×3): 100 mg via ORAL
  Filled 2017-04-07 (×3): qty 1

## 2017-04-07 MED ORDER — FENTANYL CITRATE (PF) 250 MCG/5ML IJ SOLN
INTRAMUSCULAR | Status: AC
  Filled 2017-04-07: qty 5

## 2017-04-07 MED ORDER — FENTANYL CITRATE (PF) 100 MCG/2ML IJ SOLN
INTRAMUSCULAR | Status: DC | PRN
  Administered 2017-04-07: 150 ug via INTRAVENOUS
  Administered 2017-04-07 (×4): 50 ug via INTRAVENOUS

## 2017-04-07 MED ORDER — DIAZEPAM 5 MG PO TABS
ORAL_TABLET | ORAL | Status: AC
  Administered 2017-04-07: 5 mg via ORAL
  Filled 2017-04-07: qty 1

## 2017-04-07 MED ORDER — THROMBIN 20000 UNITS EX SOLR
CUTANEOUS | Status: AC
  Filled 2017-04-07: qty 20000

## 2017-04-07 MED ORDER — MIDAZOLAM HCL 5 MG/5ML IJ SOLN
INTRAMUSCULAR | Status: DC | PRN
  Administered 2017-04-07: 2 mg via INTRAVENOUS

## 2017-04-07 MED ORDER — VITAMIN D 1000 UNITS PO TABS
2000.0000 [IU] | ORAL_TABLET | Freq: Every day | ORAL | Status: DC
Start: 2017-04-08 — End: 2017-04-08
  Administered 2017-04-08: 2000 [IU] via ORAL
  Filled 2017-04-07: qty 2

## 2017-04-07 MED ORDER — BUPIVACAINE LIPOSOME 1.3 % IJ SUSP
INTRAMUSCULAR | Status: DC | PRN
  Administered 2017-04-07: 20 mL

## 2017-04-07 MED ORDER — PHENOL 1.4 % MT LIQD: 1.0000 | OROMUCOSAL | Status: DC | PRN

## 2017-04-07 MED ORDER — HYDROMORPHONE HCL 1 MG/ML IJ SOLN
INTRAMUSCULAR | Status: AC
  Administered 2017-04-07: 0.5 mg via INTRAVENOUS
  Filled 2017-04-07: qty 0.5

## 2017-04-07 MED ORDER — HYDROMORPHONE HCL 1 MG/ML IJ SOLN
0.2500 mg | INTRAMUSCULAR | Status: DC | PRN
  Administered 2017-04-07 (×3): 0.5 mg via INTRAVENOUS

## 2017-04-07 MED ORDER — 0.9 % SODIUM CHLORIDE (POUR BTL) OPTIME
TOPICAL | Status: DC | PRN
  Administered 2017-04-07: 1000 mL

## 2017-04-07 MED ORDER — ZOLPIDEM TARTRATE 5 MG PO TABS: 5.0000 mg | ORAL_TABLET | Freq: Every evening | ORAL | Status: DC | PRN

## 2017-04-07 MED ORDER — ONDANSETRON HCL 4 MG PO TABS
4.0000 mg | ORAL_TABLET | Freq: Four times a day (QID) | ORAL | Status: DC | PRN
  Administered 2017-04-07: 4 mg via ORAL
  Filled 2017-04-07: qty 1

## 2017-04-07 MED ORDER — SUGAMMADEX SODIUM 200 MG/2ML IV SOLN
INTRAVENOUS | Status: AC
  Filled 2017-04-07: qty 2

## 2017-04-07 MED ORDER — THROMBIN 20000 UNITS EX SOLR
OROMUCOSAL | Status: DC | PRN
  Administered 2017-04-07: 08:00:00 via TOPICAL

## 2017-04-07 MED ORDER — CALCIUM CARBONATE-VITAMIN D 500-200 MG-UNIT PO TABS
1.0000 | ORAL_TABLET | Freq: Every day | ORAL | Status: DC
  Administered 2017-04-08: 09:00:00 1 via ORAL
  Filled 2017-04-07 (×2): qty 1

## 2017-04-07 MED ORDER — VANCOMYCIN HCL IN DEXTROSE 1-5 GM/200ML-% IV SOLN
1000.0000 mg | Freq: Once | INTRAVENOUS | Status: AC
  Administered 2017-04-07: 1000 mg via INTRAVENOUS
  Filled 2017-04-07: qty 200

## 2017-04-07 MED ORDER — MORPHINE SULFATE (PF) 4 MG/ML IV SOLN
INTRAVENOUS | Status: AC
Start: 2017-04-07 — End: 2017-04-07
  Administered 2017-04-07: 2 mg
  Filled 2017-04-07: qty 1

## 2017-04-07 MED ORDER — LACTATED RINGERS IV SOLN
INTRAVENOUS | Status: DC | PRN
  Administered 2017-04-07 (×2): via INTRAVENOUS

## 2017-04-07 MED ORDER — HYDROMORPHONE HCL 1 MG/ML IJ SOLN
INTRAMUSCULAR | Status: AC
Start: 2017-04-07 — End: 2017-04-07
  Administered 2017-04-07: 0.5 mg via INTRAVENOUS
  Filled 2017-04-07: qty 0.5

## 2017-04-07 MED ORDER — DIAZEPAM 5 MG PO TABS
5.0000 mg | ORAL_TABLET | Freq: Four times a day (QID) | ORAL | Status: DC | PRN
  Administered 2017-04-07 – 2017-04-08 (×3): 5 mg via ORAL
  Filled 2017-04-07 (×2): qty 1

## 2017-04-07 MED ORDER — OXYCODONE-ACETAMINOPHEN 5-325 MG PO TABS
1.0000 | ORAL_TABLET | ORAL | Status: DC | PRN
  Administered 2017-04-07 – 2017-04-08 (×5): 2 via ORAL
  Filled 2017-04-07 (×5): qty 2

## 2017-04-07 SURGICAL SUPPLY — 80 items
APL SKNCLS STERI-STRIP NONHPOA (GAUZE/BANDAGES/DRESSINGS) ×1
BENZOIN TINCTURE PRP APPL 2/3 (GAUZE/BANDAGES/DRESSINGS) ×2 IMPLANT
BIT DRILL NEURO 2X3.1 SFT TUCH (MISCELLANEOUS) ×1 IMPLANT
BIT DRILL SRG 14X2.2XFLT CHK (BIT) ×1 IMPLANT
BIT DRL SRG 14X2.2XFLT CHK (BIT) ×1
BLADE CLIPPER SURG (BLADE) ×2 IMPLANT
BLADE SURG 15 STRL LF DISP TIS (BLADE) ×1 IMPLANT
BLADE SURG 15 STRL SS (BLADE) ×2
BONE VIVIGEN FORMABLE 1.3CC (Bone Implant) ×2 IMPLANT
BUR MATCHSTICK NEURO 3.0 LAGG (BURR) IMPLANT
CARTRIDGE OIL MAESTRO DRILL (MISCELLANEOUS) ×1 IMPLANT
CLSR STERI-STRIP ANTIMIC 1/2X4 (GAUZE/BANDAGES/DRESSINGS) ×1 IMPLANT
COLLAR CERV LO CONTOUR FIRM DE (SOFTGOODS) IMPLANT
CORDS BIPOLAR (ELECTRODE) ×2 IMPLANT
COVER SURGICAL LIGHT HANDLE (MISCELLANEOUS) ×2 IMPLANT
CRADLE DONUT ADULT HEAD (MISCELLANEOUS) ×2 IMPLANT
DECANTER SPIKE VIAL GLASS SM (MISCELLANEOUS) ×2 IMPLANT
DIFFUSER DRILL AIR PNEUMATIC (MISCELLANEOUS) ×2 IMPLANT
DRAIN JACKSON RD 7FR 3/32 (WOUND CARE) IMPLANT
DRAPE C-ARM 42X72 X-RAY (DRAPES) ×2 IMPLANT
DRAPE POUCH INSTRU U-SHP 10X18 (DRAPES) ×2 IMPLANT
DRAPE SURG 17X23 STRL (DRAPES) ×8 IMPLANT
DRILL BIT SKYLINE 14MM (BIT) ×2
DRILL NEURO 2X3.1 SOFT TOUCH (MISCELLANEOUS) ×2
DURAPREP 26ML APPLICATOR (WOUND CARE) ×2 IMPLANT
ELECT COATED BLADE 2.86 ST (ELECTRODE) ×2 IMPLANT
ELECT REM PT RETURN 9FT ADLT (ELECTROSURGICAL) ×2
ELECTRODE REM PT RTRN 9FT ADLT (ELECTROSURGICAL) ×1 IMPLANT
EVACUATOR SILICONE 100CC (DRAIN) IMPLANT
GAUZE SPONGE 4X4 12PLY STRL (GAUZE/BANDAGES/DRESSINGS) ×2 IMPLANT
GAUZE SPONGE 4X4 16PLY XRAY LF (GAUZE/BANDAGES/DRESSINGS) ×2 IMPLANT
GLOVE BIO SURGEON STRL SZ7 (GLOVE) ×2 IMPLANT
GLOVE BIO SURGEON STRL SZ8 (GLOVE) ×2 IMPLANT
GLOVE BIOGEL PI IND STRL 7.0 (GLOVE) ×2 IMPLANT
GLOVE BIOGEL PI IND STRL 8 (GLOVE) ×1 IMPLANT
GLOVE BIOGEL PI INDICATOR 7.0 (GLOVE) ×2
GLOVE BIOGEL PI INDICATOR 8 (GLOVE) ×1
GOWN STRL REUS W/ TWL LRG LVL3 (GOWN DISPOSABLE) ×1 IMPLANT
GOWN STRL REUS W/ TWL XL LVL3 (GOWN DISPOSABLE) ×1 IMPLANT
GOWN STRL REUS W/TWL LRG LVL3 (GOWN DISPOSABLE) ×2
GOWN STRL REUS W/TWL XL LVL3 (GOWN DISPOSABLE) ×2
GRAFT BNE MATRIX VG FRMBL SM 1 (Bone Implant) IMPLANT
INTERLOCK LRDTC CRVCL VBR 7MM (Bone Implant) ×1 IMPLANT
INTERLOCK LRDTC CRVCL VBR SM (Bone Implant) IMPLANT
IV CATH 14GX2 1/4 (CATHETERS) ×2 IMPLANT
KIT BASIN OR (CUSTOM PROCEDURE TRAY) ×2 IMPLANT
KIT ROOM TURNOVER OR (KITS) ×2 IMPLANT
LORDOTIC CERVICAL VBR 7MM SM (Bone Implant) ×2 IMPLANT
LORDOTIC CERVICAL VBR SM 5MM (Bone Implant) ×2 IMPLANT
MANIFOLD NEPTUNE II (INSTRUMENTS) ×2 IMPLANT
NDL PRECISIONGLIDE 27X1.5 (NEEDLE) ×1 IMPLANT
NDL SPNL 20GX3.5 QUINCKE YW (NEEDLE) ×1 IMPLANT
NEEDLE PRECISIONGLIDE 27X1.5 (NEEDLE) ×2 IMPLANT
NEEDLE SPNL 20GX3.5 QUINCKE YW (NEEDLE) ×2 IMPLANT
NS IRRIG 1000ML POUR BTL (IV SOLUTION) ×2 IMPLANT
OIL CARTRIDGE MAESTRO DRILL (MISCELLANEOUS) ×2
PACK ORTHO CERVICAL (CUSTOM PROCEDURE TRAY) ×2 IMPLANT
PAD ARMBOARD 7.5X6 YLW CONV (MISCELLANEOUS) ×4 IMPLANT
PATTIES SURGICAL .5 X.5 (GAUZE/BANDAGES/DRESSINGS) IMPLANT
PATTIES SURGICAL .5 X1 (DISPOSABLE) ×2 IMPLANT
PIN DISTRACTION 14 (PIN) ×4 IMPLANT
PLATE SKYLINE 2LVL 26MM (Plate) ×2 IMPLANT
SCREW SKYLINE VAR OS 14MM (Screw) ×6 IMPLANT
SPONGE INTESTINAL PEANUT (DISPOSABLE) ×2 IMPLANT
SPONGE SURGIFOAM ABS GEL 100 (HEMOSTASIS) ×2 IMPLANT
STRIP CLOSURE SKIN 1/2X4 (GAUZE/BANDAGES/DRESSINGS) ×2 IMPLANT
SURGIFLO W/THROMBIN 8M KIT (HEMOSTASIS) ×2 IMPLANT
SUT MNCRL AB 4-0 PS2 18 (SUTURE) ×2 IMPLANT
SUT SILK 4 0 (SUTURE)
SUT SILK 4-0 18XBRD TIE 12 (SUTURE) IMPLANT
SUT VIC AB 2-0 CT2 18 VCP726D (SUTURE) ×2 IMPLANT
SYR BULB IRRIGATION 50ML (SYRINGE) ×2 IMPLANT
SYR CONTROL 10ML LL (SYRINGE) ×6 IMPLANT
TAPE CLOTH 4X10 WHT NS (GAUZE/BANDAGES/DRESSINGS) ×2 IMPLANT
TAPE CLOTH SURG 4X10 WHT LF (GAUZE/BANDAGES/DRESSINGS) ×1 IMPLANT
TAPE UMBILICAL COTTON 1/8X30 (MISCELLANEOUS) ×2 IMPLANT
TOWEL OR 17X24 6PK STRL BLUE (TOWEL DISPOSABLE) ×2 IMPLANT
TOWEL OR 17X26 10 PK STRL BLUE (TOWEL DISPOSABLE) ×2 IMPLANT
WATER STERILE IRR 1000ML POUR (IV SOLUTION) ×2 IMPLANT
YANKAUER SUCT BULB TIP NO VENT (SUCTIONS) ×2 IMPLANT

## 2017-04-07 NOTE — Anesthesia Procedure Notes (Addendum)
Procedure Name: Intubation Date/Time: 04/07/2017 7:34 AM Performed by: Lovie CholOCK, Ludy Messamore K Pre-anesthesia Checklist: Patient identified, Emergency Drugs available, Suction available and Patient being monitored Patient Re-evaluated:Patient Re-evaluated prior to induction Oxygen Delivery Method: Circle System Utilized Preoxygenation: Pre-oxygenation with 100% oxygen Induction Type: IV induction Ventilation: Mask ventilation without difficulty Laryngoscope Size: Miller and 2 Grade View: Grade I Tube type: Oral Tube size: 7.0 mm Number of attempts: 1 Airway Equipment and Method: Stylet and Oral airway Placement Confirmation: ETT inserted through vocal cords under direct vision,  positive ETCO2 and breath sounds checked- equal and bilateral Secured at: 21 cm Tube secured with: Tape Dental Injury: Teeth and Oropharynx as per pre-operative assessment

## 2017-04-07 NOTE — Progress Notes (Signed)
Pharmacy Antibiotic Note  Kathryn [name] is a [age] y.o. 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 Pt is allergic to PCN. She received Vancomycin 1.5 g IV x1 preop today @ 0715.  No drain. CrCl ~ 2779ml/min   Plan:  Vancomycin 1000 mg IV x1 tonight 12 hours post op x1.    Weight: 192 lb (87.1 kg)  Temp (24hrs), Avg:98.2 F (36.8 C), Min:97.9 F (36.6 C), Max:98.4 F (36.9 C)  No results for input(s): WBC, CREATININE, LATICACIDVEN, VANCOTROUGH, VANCOPEAK, VANCORANDOM, GENTTROUGH, GENTPEAK, GENTRANDOM, TOBRATROUGH, TOBRAPEAK, TOBRARND, AMIKACINPEAK, AMIKACINTROU, AMIKACIN in the last 168 hours.  Estimated Creatinine Clearance: 79.5 mL/min (by C-G formula based on SCr of 0.64 mg/dL).    Allergies  Allergen Reactions  . Iodinated Diagnostic Agents Other (See Comments)    Unknown : childhood reaction   . Penicillins Hives    Has patient had a PCN reaction causing immediate rash, facial/tongue/throat swelling, SOB or lightheadedness with hypotension: No Has patient had a PCN reaction causing severe rash involving mucus membranes or skin necrosis: No Has patient had a PCN reaction that required hospitalization: No Has patient had a PCN reaction occurring within the last 10 years: No If all of the above answers are "NO", then may proceed with Cephalosporin use.     Antimicrobials this admission: 7/19 Vancomycin preop dose x1 and post op x1  Dose adjustments this admission: n/a Microbiology results:no cultures   Thank you for allowing pharmacy to be a part of this patient's care.  Kathryn [name], RPh Clinical Pharmacist Pager: [phone] [facility] [phone] 04/07/2017 12:33 PM

## 2017-04-07 NOTE — Anesthesia Postprocedure Evaluation (Signed)
Anesthesia Post Note  Patient: Kathryn [name]  Procedure(s) Performed: Procedure(s) (LRB): ANTERIOR CERVICAL DECOMPRESSION FUSION, CERVICAL 5-6, CERVICAL 6-7 WITH INSTRUMENTATION AND ALLOGRAFT (Right)     Patient location during evaluation: PACU Anesthesia Type: General Level of consciousness: sedated Pain management: pain level controlled Vital Signs Assessment: post-procedure vital signs reviewed and stable Respiratory status: spontaneous breathing and respiratory function stable Cardiovascular status: stable Anesthetic complications: no    Last Vitals:  Vitals:   04/07/17 1137 04/07/17 1145  BP: (!) 143/70   Pulse: 85 68  Resp: 17 14  Temp:      Last Pain:  Vitals:   04/07/17 1130  TempSrc:   PainSc: Asleep                 Bresha Hosack DANIEL

## 2017-04-07 NOTE — Addendum Note (Signed)
Addendum  created 04/07/17 1258 by Lovie Cholock, Kamir Selover K, CRNA   Anesthesia Intra Meds edited

## 2017-04-07 NOTE — Op Note (Signed)
NAME:  Kathryn [name], Kathryn [name]                     ACCOUNT NO.:  MEDICAL RECORD NO.:  [id number]  PHYSICIAN:  Estill BambergMark Purl Claytor, MD           DATE OF BIRTH:  DATE OF PROCEDURE:  04/07/2017                              OPERATIVE REPORT   PREOPERATIVE DIAGNOSES: 1. Right-sided cervical radiculopathy. 2. Right-sided neural foraminal stenosis, C6-7. 3. Spinal cord compression, C5-6.  POSTOPERATIVE DIAGNOSES: 1. Right-sided cervical radiculopathy. 2. Right-sided neural foraminal stenosis, C6-7. 3. Spinal cord compression, C5-6.  PROCEDURES: 1. Anterior cervical decompression and fusion at C5-6, C6-7. 2. Placement of anterior instrumentation, C5-C7. 3. Insertion of interbody device x2 (Titan intervertebral spacers). 4. Use of morselized allograft - ViviGen. 5. Intraoperative use of fluoroscopy.  SURGEON:  Estill BambergMark Jalien Weakland, MD  ASSISTANT:  Jason CoopKayla McKenzie, PA-C.  ANESTHESIA:  General endotracheal anesthesia.  COMPLICATIONS:  None.  DISPOSITION:  Stable.  ESTIMATED BLOOD LOSS:  Minimal.  INDICATIONS FOR SURGERY:  Briefly, Ms. Kathryn [name] is a pleasant 56 year old female who did present to me with ongoing debilitating pain in her right arm.  She did fail appropriate nonoperative measures.  Her MRI did reveal the findings outlined above.  Given her ongoing pain and dysfunction, we did discuss proceeding with the procedure reflected above.  The patient was fully aware of the risks and limitations of surgery, and did elect to proceed.  OPERATIVE DETAILS:  On April 07, 2017, the patient was brought to surgery and general endotracheal anesthesia was administered.  The patient was placed supine on the hospital bed.  The patient's neck was gently extended and her arms were secured to her sides.  All bony prominences were meticulously padded.  The neck was then prepped and draped, and a time-out procedure was performed.  A left-sided transverse incision was made overlying the C6 level.  The platysma was  incised and a Clementeen GrahamSmith- Robinson approach was used.  The anterior spine was noted.  The C5, C6, and C7 vertebral bodies were subperiosteally exposed.  A self-retaining retractor was placed.  Caspar pins were then placed into the C6 and C7 vertebral bodies and distraction was applied.  After applying gentle distraction, I did perform a thorough and complete C6-7 intervertebral diskectomy, followed by a bilateral neuroforaminal decompression.  The endplates were prepared and the appropriate size interbody spacer was then packed with ViviGen and tamped into position.  The lower Caspar pin was removed and bone wax was placed in its place.  A diskectomy was then performed as previously described at the C5-6 intervertebral level.  A thorough decompression was performed at the C5-6 level, the endplates were prepared, and the appropriate-sized interbody spacer was packed with ViviGen and tamped into position.  Bone wax was placed in the holes remaining from removing the Caspar pins.  At this point, the appropriate- sized anterior cervical plate was placed over the anterior spine.  14- mm, variable angle screws were placed in each vertebral body at C5, C6, and C7, for a total of 6 vertebral body screws.  The screws were then locked to the plate using the Cam locking mechanism.  I was very pleased with the press-fit of the screws and I was very pleased with the appearance of the construct on fluoroscopic imaging.  The wound was then irrigated.  The  platysma was then closed using 2-0 Vicryl and the skin was closed using 4-0 Monocryl.  Benzoin and Steri-Strips were applied followed by sterile dressing.  All instrument counts were correct at the termination of the procedure.  Of note, Jason Coop was my assistant throughout surgery, and did aid in retraction, suctioning, and closure from start to finish.     Estill Bamberg, MD     MD/MEDQ  D:  04/07/2017  T:  04/07/2017  Job:  [id number]

## 2017-04-07 NOTE — Transfer of Care (Signed)
Immediate Anesthesia Transfer of Care Note  Patient: Kathryn [name]  Procedure(s) Performed: Procedure(s) with comments: ANTERIOR CERVICAL DECOMPRESSION FUSION, CERVICAL 5-6, CERVICAL 6-7 WITH INSTRUMENTATION AND ALLOGRAFT (Right) - ANTERIOR CERVICAL DECOMPRESSION FUSION, CERVICAL 5-6, CERVICAL 6-7 WITH INSTRUMENTATION AND ALLOGRAFT; REQUEST 2.5 HOURS  Patient Location: PACU  Anesthesia Type:General  Level of Consciousness: awake, oriented and patient cooperative  Airway & Oxygen Therapy: Patient Spontanous Breathing and Patient connected to face mask oxygen  Post-op Assessment: Report given to RN and Post -op Vital signs reviewed and stable  Post vital signs: Reviewed  Last Vitals:  Vitals:   04/07/17 0605  BP: (!) 184/71  Pulse: 64  Resp: 18  Temp: 36.9 C    Last Pain:  Vitals:   04/07/17 0658  TempSrc:   PainSc: 3          Complications: No apparent anesthesia complications

## 2017-04-07 NOTE — Progress Notes (Signed)
Orthopedic Tech Progress Note Patient Details:  Clint LippsGail L Lamarre 03/18/1961 [id number]  Ortho Devices Type of Ortho Device: Semi-rigid collar Ortho Device/Splint Location: provided philly soft collar (shower collar) at bedside.     Alvina ChouWilliams, Gracin Mcpartland C 04/07/2017, 12:59 PM

## 2017-04-08 ENCOUNTER — Encounter (HOSPITAL_COMMUNITY): Payer: Self-pay | Admitting: Orthopedic Surgery

## 2017-04-08 DIAGNOSIS — M4802 Spinal stenosis, cervical region: Secondary | ICD-10-CM | POA: Diagnosis not present

## 2017-04-08 DIAGNOSIS — Z8371 Family history of colonic polyps: Secondary | ICD-10-CM | POA: Diagnosis not present

## 2017-04-08 DIAGNOSIS — K219 Gastro-esophageal reflux disease without esophagitis: Secondary | ICD-10-CM | POA: Diagnosis not present

## 2017-04-08 DIAGNOSIS — Z836 Family history of other diseases of the respiratory system: Secondary | ICD-10-CM | POA: Diagnosis not present

## 2017-04-08 DIAGNOSIS — G9520 Unspecified cord compression: Secondary | ICD-10-CM | POA: Diagnosis not present

## 2017-04-08 DIAGNOSIS — Z806 Family history of leukemia: Secondary | ICD-10-CM | POA: Diagnosis not present

## 2017-04-08 DIAGNOSIS — Z87891 Personal history of nicotine dependence: Secondary | ICD-10-CM | POA: Diagnosis not present

## 2017-04-08 DIAGNOSIS — G4733 Obstructive sleep apnea (adult) (pediatric): Secondary | ICD-10-CM | POA: Diagnosis not present

## 2017-04-08 DIAGNOSIS — M1990 Unspecified osteoarthritis, unspecified site: Secondary | ICD-10-CM | POA: Diagnosis not present

## 2017-04-08 DIAGNOSIS — Z79899 Other long term (current) drug therapy: Secondary | ICD-10-CM | POA: Diagnosis not present

## 2017-04-08 DIAGNOSIS — M5412 Radiculopathy, cervical region: Secondary | ICD-10-CM | POA: Diagnosis not present

## 2017-04-08 DIAGNOSIS — Z8249 Family history of ischemic heart disease and other diseases of the circulatory system: Secondary | ICD-10-CM | POA: Diagnosis not present

## 2017-04-08 DIAGNOSIS — Z800 Family history of malignant neoplasm of digestive organs: Secondary | ICD-10-CM | POA: Diagnosis not present

## 2017-04-08 NOTE — Progress Notes (Signed)
Discharge to home. Discharge instructions and follow up appointments discussed with patient,verbalized understanding.No complaints of any pain or discomfort upon discharge.

## 2017-04-20 DIAGNOSIS — M5412 Radiculopathy, cervical region: Secondary | ICD-10-CM | POA: Diagnosis not present

## 2017-04-20 NOTE — Discharge Summary (Signed)
Patient ID: Kathryn [name] MRN: [id number] DOB/AGE: 57/09/1960 57 y.o.  Admit date: 04/07/2017 Discharge date: 04/08/2017  Admission Diagnoses:  Active Problems:   Radiculopathy   Discharge Diagnoses:  Same  Past Medical History:  Diagnosis Date  . Accessory skin tags   . Arthritis    lower back  . Complication of anesthesia 12/2015   block paralized right diaphram - sats dropped shoulder surgery   . GERD (gastroesophageal reflux disease)    No longer since gastric sleeve  . Goiter   . Headache    migraines as a teenager  . History of kidney stones     56 years old  . Hypertension    not current- normal since gastric sleeve  . OSA (obstructive sleep apnea)   . Overactive bladder   . Overactive bladder   . Palpitations   . Pneumonia    2005 ish, 2016  . PONV (postoperative nausea and vomiting)   . Thyroid nodule     Surgeries: Procedure(s): ANTERIOR CERVICAL DECOMPRESSION FUSION, CERVICAL 5-6, CERVICAL 6-7 WITH INSTRUMENTATION AND ALLOGRAFT on 04/07/2017   Consultants: None  Discharged Condition: Improved  Hospital Course: Kathryn [name] is an 56 y.o. female who was admitted 04/07/2017 for operative treatment of myeloradiculopathy. Patient has severe unremitting pain that affects sleep, daily activities, and work/hobbies. After pre-op clearance the patient was taken to the operating room on 04/07/2017 and underwent  Procedure(s): ANTERIOR CERVICAL DECOMPRESSION FUSION, CERVICAL 5-6, CERVICAL 6-7 WITH INSTRUMENTATION AND ALLOGRAFT.    Patient was given perioperative antibiotics:  Anti-infectives    Start     Dose/Rate Route Frequency Ordered Stop   04/07/17 2200  vancomycin (VANCOCIN) IVPB 1000 mg/200 mL premix     1,000 mg 200 mL/hr over 60 Minutes Intravenous  Once 04/07/17 1244 04/07/17 2242   04/07/17 0715  vancomycin (VANCOCIN) 1,500 mg in sodium chloride 0.9 % 500 mL IVPB     1,500 mg 250 mL/hr over 120 Minutes Intravenous To [facility] 04/06/17  0829 04/07/17 0915       Patient was given sequential compression devices, early ambulation to prevent DVT.  Patient benefited maximally from hospital stay and there were no complications.    Recent vital signs: BP 123/67   Pulse 64   Temp 98.7 F (37.1 C)   Resp 16   Wt 87.1 kg (192 lb)   SpO2 96%   BMI 35.12 kg/m   Discharge Medications:   Allergies as of 04/08/2017      Reactions   Iodinated Diagnostic Agents Other (See Comments)   Unknown : childhood reaction    Penicillins Hives   Has patient had a PCN reaction causing immediate rash, facial/tongue/throat swelling, SOB or lightheadedness with hypotension: No Has patient had a PCN reaction causing severe rash involving mucus membranes or skin necrosis: No Has patient had a PCN reaction that required hospitalization: No Has patient had a PCN reaction occurring within the last 10 years: No If all of the above answers are "NO", then may proceed with Cephalosporin use.      Medication List    TAKE these medications   B-12 1000 MCG Subl Place 1,000 mcg under the tongue daily.   BIOTIN PO Take 1 tablet by mouth daily.   CALCIUM 500+D PO Take 2 tablets by mouth daily. chews   cholecalciferol 1000 units tablet Commonly known as:  VITAMIN D Take 2,000 Units by mouth daily.   multivitamin with minerals Tabs tablet Take 1 tablet by  mouth daily.   REFRESH OP Apply 2 drops to eye daily.   valsartan-hydrochlorothiazide 160-25 MG tablet Commonly known as:  DIOVAN HCT Take 1 tablet by mouth daily.       Diagnostic Studies: Dg Cervical Spine 2-3 Views  Result Date: 04/07/2017 CLINICAL DATA:  ACDF. EXAM: CERVICAL SPINE - 2-3 VIEW; DG C-ARM 61-120 MIN COMPARISON:  MRI 11/25/2016. FINDINGS: Single lateral C-arm image shows anterior cervical discectomy and fusion from C5-C7. Components appear well positioned. Sponges remain along the operative approach. IMPRESSION: ACDF C5-C7. Electronically Signed   By: Paulina FusiMark  Shogry M.D.    On: 04/07/2017 10:36   Dg C-arm 61-120 Min  Result Date: 04/07/2017 CLINICAL DATA:  ACDF. EXAM: CERVICAL SPINE - 2-3 VIEW; DG C-ARM 61-120 MIN COMPARISON:  MRI 11/25/2016. FINDINGS: Single lateral C-arm image shows anterior cervical discectomy and fusion from C5-C7. Components appear well positioned. Sponges remain along the operative approach. IMPRESSION: ACDF C5-C7. Electronically Signed   By: Paulina FusiMark  Shogry M.D.   On: 04/07/2017 10:36    Disposition: 01-Home or Self Care   1 Day PO C5-7 ACDF  -Resolved R arm pain, doing well  -Written scripts for pain signed and in chart -D/C instructions sheet printed and in chart -D/C today  -F/U in office 2 weeks   Signed: Georga BoraMCKENZIE, Coryn Mosso J 04/20/2017, 2:35 PM

## 2017-05-20 DIAGNOSIS — Z9889 Other specified postprocedural states: Secondary | ICD-10-CM | POA: Diagnosis not present

## 2017-05-20 DIAGNOSIS — M5412 Radiculopathy, cervical region: Secondary | ICD-10-CM | POA: Diagnosis not present

## 2017-07-01 DIAGNOSIS — Z9889 Other specified postprocedural states: Secondary | ICD-10-CM | POA: Diagnosis not present

## 2017-07-01 DIAGNOSIS — M5412 Radiculopathy, cervical region: Secondary | ICD-10-CM | POA: Diagnosis not present

## 2017-10-23 DIAGNOSIS — J069 Acute upper respiratory infection, unspecified: Secondary | ICD-10-CM | POA: Diagnosis not present

## 2017-10-23 DIAGNOSIS — J029 Acute pharyngitis, unspecified: Secondary | ICD-10-CM | POA: Diagnosis not present

## 2017-10-23 DIAGNOSIS — R05 Cough: Secondary | ICD-10-CM | POA: Diagnosis not present

## 2017-10-26 IMAGING — RF DG CERVICAL SPINE 2 OR 3 VIEWS
1 series · 1 of 1 positions shown · non-contrast
Comparison: MRI 11/25/2016.

CLINICAL DATA: ACDF.

EXAM:
CERVICAL SPINE - 2-3 VIEW; DG C-ARM 61-120 MIN

[Series 1: run · 1 of 1 slices shown]
[im 1/1]
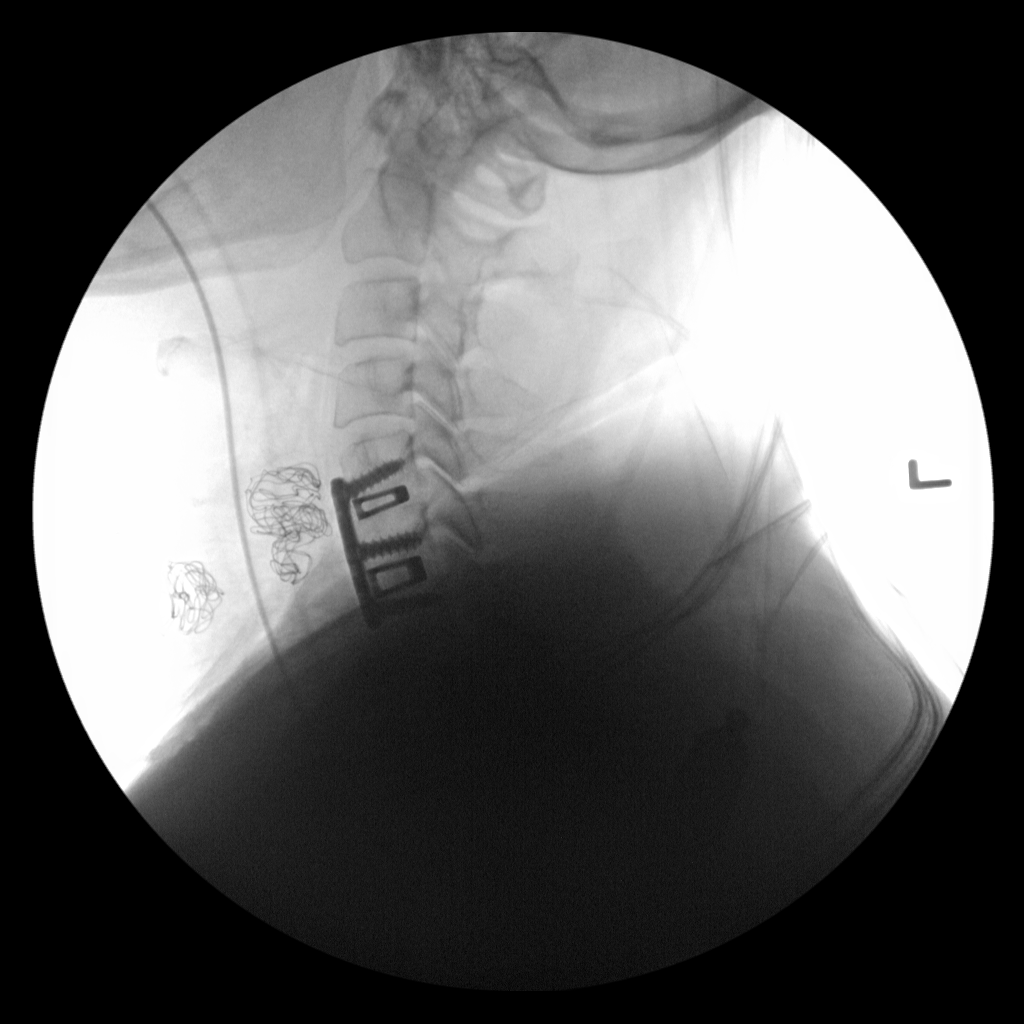

[1 of 1 positions shown; findings below may reference images not displayed]

FINDINGS: Single lateral C-arm image shows anterior cervical discectomy and
fusion from C5-C7. Components appear well positioned. Sponges remain
along the operative approach.
IMPRESSION: ACDF C5-C7.

## 2017-12-27 DIAGNOSIS — Z13220 Encounter for screening for lipoid disorders: Secondary | ICD-10-CM | POA: Diagnosis not present

## 2017-12-27 DIAGNOSIS — J069 Acute upper respiratory infection, unspecified: Secondary | ICD-10-CM | POA: Diagnosis not present

## 2017-12-27 DIAGNOSIS — M25551 Pain in right hip: Secondary | ICD-10-CM | POA: Diagnosis not present

## 2017-12-27 DIAGNOSIS — Z0001 Encounter for general adult medical examination with abnormal findings: Secondary | ICD-10-CM | POA: Diagnosis not present

## 2017-12-27 DIAGNOSIS — Z131 Encounter for screening for diabetes mellitus: Secondary | ICD-10-CM | POA: Diagnosis not present

## 2018-08-31 DIAGNOSIS — M25552 Pain in left hip: Secondary | ICD-10-CM | POA: Diagnosis not present

## 2018-08-31 DIAGNOSIS — M545 Low back pain: Secondary | ICD-10-CM | POA: Diagnosis not present

## 2020-10-10 ENCOUNTER — Other Ambulatory Visit: Payer: Self-pay | Admitting: Family Medicine

## 2020-10-10 DIAGNOSIS — Z1231 Encounter for screening mammogram for malignant neoplasm of breast: Secondary | ICD-10-CM

## 2020-11-25 ENCOUNTER — Other Ambulatory Visit: Payer: Self-pay

## 2020-11-25 ENCOUNTER — Ambulatory Visit
Admission: RE | Admit: 2020-11-25 | Discharge: 2020-11-25 | Disposition: A | Discharge status: Home / Self Care | Payer: 59 | Source: Ambulatory Visit | Attending: Family Medicine | Admitting: Family Medicine

## 2020-11-25 DIAGNOSIS — Z1231 Encounter for screening mammogram for malignant neoplasm of breast: Secondary | ICD-10-CM

## 2021-02-26 ENCOUNTER — Other Ambulatory Visit: Payer: Self-pay | Admitting: Orthopaedic Surgery

## 2021-02-26 DIAGNOSIS — M25551 Pain in right hip: Secondary | ICD-10-CM

## 2021-03-12 ENCOUNTER — Ambulatory Visit
Admission: RE | Admit: 2021-03-12 | Discharge: 2021-03-12 | Disposition: A | Discharge status: Home / Self Care | Payer: 59 | Source: Ambulatory Visit | Attending: Orthopaedic Surgery | Admitting: Orthopaedic Surgery

## 2021-03-12 DIAGNOSIS — M25551 Pain in right hip: Secondary | ICD-10-CM

## 2022-08-22 ENCOUNTER — Other Ambulatory Visit (HOSPITAL_BASED_OUTPATIENT_CLINIC_OR_DEPARTMENT_OTHER): Payer: Commercial Managed Care - HMO

## 2022-08-22 ENCOUNTER — Emergency Department (HOSPITAL_BASED_OUTPATIENT_CLINIC_OR_DEPARTMENT_OTHER): Payer: Commercial Managed Care - HMO

## 2022-08-22 ENCOUNTER — Other Ambulatory Visit: Payer: Self-pay

## 2022-08-22 ENCOUNTER — Encounter (HOSPITAL_BASED_OUTPATIENT_CLINIC_OR_DEPARTMENT_OTHER): Payer: Self-pay | Admitting: Emergency Medicine

## 2022-08-22 ENCOUNTER — Emergency Department (HOSPITAL_BASED_OUTPATIENT_CLINIC_OR_DEPARTMENT_OTHER)
Admission: EM | Admit: 2022-08-22 | Discharge: 2022-08-22 | Disposition: A | Discharge status: Home / Self Care | Payer: Commercial Managed Care - HMO | Attending: Emergency Medicine | Admitting: Emergency Medicine

## 2022-08-22 DIAGNOSIS — I10 Essential (primary) hypertension: Secondary | ICD-10-CM | POA: Insufficient documentation

## 2022-08-22 DIAGNOSIS — M25561 Pain in right knee: Secondary | ICD-10-CM | POA: Insufficient documentation

## 2022-08-22 DIAGNOSIS — M7989 Other specified soft tissue disorders: Secondary | ICD-10-CM | POA: Diagnosis not present

## 2022-08-22 MED ORDER — ACETAMINOPHEN-CODEINE 300-30 MG PO TABS: 1.0000 | ORAL_TABLET | Freq: Four times a day (QID) | ORAL | 0 refills | Status: DC | PRN

## 2022-08-22 MED ORDER — HYDROCODONE-ACETAMINOPHEN 5-325 MG PO TABS
1.0000 | ORAL_TABLET | Freq: Once | ORAL | Status: AC
  Administered 2022-08-22: 1 via ORAL
  Filled 2022-08-22: qty 1

## 2022-08-22 NOTE — ED Provider Notes (Signed)
[facility] EMERGENCY DEPARTMENT Provider Note   CSN: [id number] Arrival date & time: 08/22/22  0854     History  Chief Complaint  Patient presents with   Leg Injury    Kathryn [name] is a 61 y.o. female.  With a history of arthritis, hypertension who presents ED for evaluation of right knee pain.  She states that she was in bed 2 weeks ago when her foot got caught in the sheets and she rolled over and twisted her knee.  Pain has been persistent since that time.  She has been taking ibuprofen and someone else's hydrocodone without relief.  She states that yesterday she stepped on from a curb and her right leg gave out.  She did catch herself before completely falling, however pain in the right knee got significantly worse.  Reports she is now unable to walk due to the pain.  Concerned for blood clot as the pain is in the posterior of the knee.  No history of DVT.  Describes the pain as an intense ache and rates it at a 10 out of 10.  States that her foot occasionally gets numb when she is elevating it above the level of her heart when resting.  Denies tingling, weakness, cool sensation, ankle or hip pain.  HPI     Home Medications Prior to Admission medications   Medication Sig Start Date End Date Taking? Authorizing Provider  BIOTIN PO Take 1 tablet by mouth daily.    [provider]  Calcium Carb-Cholecalciferol (CALCIUM 500+D PO) Take 2 tablets by mouth daily. chews    [provider]  cholecalciferol (VITAMIN D) 1000 units tablet Take 2,000 Units by mouth daily.    [provider]  Cyanocobalamin (B-12) 1000 MCG SUBL Place 1,000 mcg under the tongue daily.    [provider]  Multiple Vitamin (MULTIVITAMIN WITH MINERALS) TABS tablet Take 1 tablet by mouth daily.    [provider]  Polyvinyl Alcohol-Povidone (REFRESH OP) Apply 2 drops to eye daily.    [provider]  valsartan-hydrochlorothiazide (DIOVAN HCT) 160-25 MG  per tablet Take 1 tablet by mouth daily. Patient not taking: Reported on 03/24/2017 10/23/14   Nyoka Cowden, MD      Allergies    Iodinated contrast media and Penicillins    Review of Systems   Review of Systems  Musculoskeletal:  Positive for arthralgias.    Physical Exam Updated Vital Signs BP (!) 187/94   Pulse 72   Temp 98.2 F (36.8 C) (Oral)   Resp 20   Ht 5\' 2"  (1.575 m)   Wt 95.3 kg   SpO2 97%   BMI 38.41 kg/m  Physical Exam Vitals and nursing note reviewed.  Constitutional:      General: She is not in acute distress.    Appearance: Normal appearance. She is obese. She is not ill-appearing.  HENT:     Head: Normocephalic and atraumatic.  Cardiovascular:     Pulses: Normal pulses.  Pulmonary:     Effort: Pulmonary effort is normal. No respiratory distress.  Abdominal:     General: Abdomen is flat.  Musculoskeletal:        General: Swelling present. Normal range of motion.     Cervical back: Neck supple.     Right lower leg: No edema.     Left lower leg: No edema.     Comments: Bilateral lower extremity swelling most likely secondary to body habitus.  No edema or  erythema.  Range of motion slightly limited secondary to pain.  Good capillary refill bilaterally.  There is mild tenderness to palpation of the posterior of the right knee.  Skin:    General: Skin is warm and dry.     Capillary Refill: Capillary refill takes less than 2 seconds.     Findings: No erythema.  Neurological:     General: No focal deficit present.     Mental Status: She is alert and oriented to person, place, and time.  Psychiatric:        Mood and Affect: Mood normal.        Behavior: Behavior normal.     ED Results / Procedures / Treatments   Labs (all labs ordered are listed, but only abnormal results are displayed) Labs Reviewed - No data to display  EKG None  Radiology DG Knee Complete 4 Views Right  Result Date: 08/22/2022 CLINICAL DATA:  Right knee pain. EXAM: RIGHT  KNEE - COMPLETE 4+ VIEW COMPARISON:  None Available. FINDINGS: Subtle early degenerative changes over the medial compartment. No fracture or dislocation. No significant joint effusion. No focal bony abnormality. Soft tissues are otherwise unremarkable. IMPRESSION: 1. No acute findings. 2. Subtle early degenerative changes. Electronically Signed   By: Elberta Fortis M.D.   On: 08/22/2022 10:50   US Venous Img Lower Right (DVT Study)  Result Date: 08/22/2022 CLINICAL DATA:  Right lower extremity pain. EXAM: RIGHT LOWER EXTREMITY VENOUS DOPPLER ULTRASOUND TECHNIQUE: Gray-scale sonography with compression, as well as color and duplex ultrasound, were performed to evaluate the deep venous system(s) from the level of the common femoral vein through the popliteal and proximal calf veins. COMPARISON:  None Available. FINDINGS: VENOUS Normal compressibility of the common femoral, superficial femoral, and popliteal veins, as well as the visualized calf veins. Visualized portions of profunda femoral vein and great saphenous vein unremarkable. No filling defects to suggest DVT on grayscale or color Doppler imaging. Doppler waveforms show normal direction of venous flow, normal respiratory plasticity and response to augmentation. Limited views of the contralateral common femoral vein are unremarkable. OTHER None. Limitations: none IMPRESSION: Negative. Electronically Signed   By: Elberta Fortis M.D.   On: 08/22/2022 10:49    Procedures Procedures    Medications Ordered in ED Medications  HYDROcodone-acetaminophen (NORCO/VICODIN) 5-325 MG per tablet 1 tablet (1 tablet Oral Given 08/22/22 1000)    ED Course/ Medical Decision Making/ A&P                           Medical Decision Making Amount and/or Complexity of Data Reviewed Radiology: ordered.  Risk Prescription drug management.  This patient presents to the ED for concern of right knee pain, this involves an extensive number of treatment options, and is a  complaint that carries with it a high risk of complications and morbidity.  The differential diagnosis includes DVT, fracture, dislocation, effusion, contusion, sprain or strain   Co morbidities that complicate the patient evaluation   obesity, arthritis  My initial workup includes pain control, x-ray right knee, ultrasound  Additional history obtained from: Nursing notes from this visit.   I ordered imaging studies including DVT study, x-ray right knee I independently visualized and interpreted imaging which showed no acute changes, no DVT I agree with the radiologist interpretation  Afebrile, hemodynamically stable.  61 year old female presenting to the ED for evaluation of right knee pain as described in HPI.  Physical exam is remarkable for  mild tenderness of the posterior of the right knee and slightly decreased range of motion.  Exam is otherwise unremarkable.  X-ray and DVT study negative.  Patient reported symptoms improved after hydrocodone.  She was given an Ace wrap, crutches and prescription for Tylenol 3.  Patient likely has musculoskeletal including tendon versus ligament injury of the right knee as a cause of her pain.  She states that she does have a sports medicine physician that she follows for other issues and we will call him on Monday to schedule an appointment for follow-up.  Gave patient return precautions.  Stable at discharge.  At this time there does not appear to be any evidence of an acute emergency medical condition and the patient appears stable for discharge with appropriate outpatient follow up. Diagnosis was discussed with patient who verbalizes understanding of care plan and is agreeable to discharge. I have discussed return precautions with patient who verbalizes understanding. Patient encouraged to follow-up with their PCP within 1 week. All questions answered.  Patient's case discussed with Dr. Jearld Fenton who agrees with plan to discharge with follow-up.   Note:  Portions of this report may have been transcribed using voice recognition software. Every effort was made to ensure accuracy; however, inadvertent computerized transcription errors may still be present.          Final Clinical Impression(s) / ED Diagnoses Final diagnoses:  Acute pain of right knee    Rx / DC Orders ED Discharge Orders     None         Michelle Piper, PA-C 08/22/22 1137    Loetta Rough, MD 08/23/22 1401

## 2022-08-22 NOTE — Discharge Instructions (Addendum)
You have been seen today for your complaint of right knee pain. Your imaging was unremarkable. Your discharge medications include Tylenol 3 and ibuprofen.  You may alternate regular Tylenol and ibuprofen every 4 hours.  You may take up to 800 mg of ibuprofen and up to 1000 mg of Tylenol at a time.  You should only take Tylenol 3 as needed. Home care instructions are as follows:  Weight-bear as tolerated.  Perform gentle range of motion exercises daily Follow up with: Your sports medicine physician next week Please seek immediate medical care if you develop any of the following symptoms: Your knee swells, and the swelling gets worse. You cannot move your knee. You have very bad knee pain that does not get better with pain medicine. At this time there does not appear to be the presence of an emergent medical condition, however there is always the potential for conditions to change. Please read and follow the below instructions.  Do not take your medicine if  develop an itchy rash, swelling in your mouth or lips, or difficulty breathing; call 911 and seek immediate emergency medical attention if this occurs.  You may review your lab tests and imaging results in their entirety on your MyChart account.  Please discuss all results of fully with your primary care provider and other specialist at your follow-up visit.  Note: Portions of this text may have been transcribed using voice recognition software. Every effort was made to ensure accuracy; however, inadvertent computerized transcription errors may still be present.

## 2022-08-22 NOTE — ED Notes (Signed)
US at bedside

## 2022-08-22 NOTE — ED Notes (Signed)
Teaching done with crutches.  Pt. Demonstrated crutch walking with no difficulty.  Pt. In no distress at time of discharge.

## 2022-08-22 NOTE — ED Triage Notes (Signed)
Pt arrives pov, to triage in wheelchair, c/o RT leg pain, posterior knee x 2 weeks, endorses "stumble" yesterday and pain has increased and now pain is radiating throughout leg

## 2022-09-23 ENCOUNTER — Other Ambulatory Visit: Payer: Self-pay | Admitting: Orthopedic Surgery

## 2022-09-23 DIAGNOSIS — M25561 Pain in right knee: Secondary | ICD-10-CM

## 2022-10-21 ENCOUNTER — Ambulatory Visit
Admission: RE | Admit: 2022-10-21 | Discharge: 2022-10-21 | Disposition: A | Discharge status: Home / Self Care | Payer: Commercial Managed Care - HMO | Source: Ambulatory Visit | Attending: Orthopedic Surgery | Admitting: Orthopedic Surgery

## 2022-10-21 DIAGNOSIS — S83231A Complex tear of medial meniscus, current injury, right knee, initial encounter: Secondary | ICD-10-CM | POA: Diagnosis not present

## 2022-10-21 DIAGNOSIS — M1711 Unilateral primary osteoarthritis, right knee: Secondary | ICD-10-CM | POA: Diagnosis not present

## 2022-10-21 DIAGNOSIS — M25561 Pain in right knee: Secondary | ICD-10-CM

## 2022-10-21 DIAGNOSIS — M25461 Effusion, right knee: Secondary | ICD-10-CM | POA: Diagnosis not present

## 2023-05-25 ENCOUNTER — Ambulatory Visit: Payer: Commercial Managed Care - HMO | Admitting: Family Medicine

## 2023-05-25 ENCOUNTER — Ambulatory Visit (INDEPENDENT_AMBULATORY_CARE_PROVIDER_SITE_OTHER): Payer: Commercial Managed Care - HMO

## 2023-05-25 ENCOUNTER — Other Ambulatory Visit: Payer: Self-pay | Admitting: Family Medicine

## 2023-05-25 ENCOUNTER — Encounter: Payer: Self-pay | Admitting: Family Medicine

## 2023-05-25 VITALS — BP 148/100 | HR 76 | Temp 97.3°F | Ht 62.0 in | Wt 222.0 lb

## 2023-05-25 DIAGNOSIS — Z6841 Body Mass Index (BMI) 40.0 and over, adult: Secondary | ICD-10-CM

## 2023-05-25 DIAGNOSIS — R5383 Other fatigue: Secondary | ICD-10-CM | POA: Diagnosis not present

## 2023-05-25 DIAGNOSIS — R002 Palpitations: Secondary | ICD-10-CM | POA: Diagnosis not present

## 2023-05-25 DIAGNOSIS — F411 Generalized anxiety disorder: Secondary | ICD-10-CM | POA: Insufficient documentation

## 2023-05-25 DIAGNOSIS — E559 Vitamin D deficiency, unspecified: Secondary | ICD-10-CM

## 2023-05-25 DIAGNOSIS — J309 Allergic rhinitis, unspecified: Secondary | ICD-10-CM | POA: Insufficient documentation

## 2023-05-25 DIAGNOSIS — Z7689 Persons encountering health services in other specified circumstances: Secondary | ICD-10-CM | POA: Diagnosis not present

## 2023-05-25 DIAGNOSIS — Z9884 Bariatric surgery status: Secondary | ICD-10-CM

## 2023-05-25 DIAGNOSIS — L509 Urticaria, unspecified: Secondary | ICD-10-CM | POA: Insufficient documentation

## 2023-05-25 DIAGNOSIS — E041 Nontoxic single thyroid nodule: Secondary | ICD-10-CM | POA: Insufficient documentation

## 2023-05-25 DIAGNOSIS — F32A Depression, unspecified: Secondary | ICD-10-CM

## 2023-05-25 DIAGNOSIS — K219 Gastro-esophageal reflux disease without esophagitis: Secondary | ICD-10-CM | POA: Insufficient documentation

## 2023-05-25 DIAGNOSIS — Z124 Encounter for screening for malignant neoplasm of cervix: Secondary | ICD-10-CM

## 2023-05-25 DIAGNOSIS — L309 Dermatitis, unspecified: Secondary | ICD-10-CM | POA: Insufficient documentation

## 2023-05-25 DIAGNOSIS — I10 Essential (primary) hypertension: Secondary | ICD-10-CM | POA: Diagnosis not present

## 2023-05-25 DIAGNOSIS — M25519 Pain in unspecified shoulder: Secondary | ICD-10-CM | POA: Insufficient documentation

## 2023-05-25 DIAGNOSIS — E042 Nontoxic multinodular goiter: Secondary | ICD-10-CM | POA: Insufficient documentation

## 2023-05-25 DIAGNOSIS — G4730 Sleep apnea, unspecified: Secondary | ICD-10-CM

## 2023-05-25 DIAGNOSIS — E876 Hypokalemia: Secondary | ICD-10-CM

## 2023-05-25 DIAGNOSIS — M545 Low back pain, unspecified: Secondary | ICD-10-CM | POA: Insufficient documentation

## 2023-05-25 DIAGNOSIS — M5090 Cervical disc disorder, unspecified, unspecified cervical region: Secondary | ICD-10-CM | POA: Insufficient documentation

## 2023-05-25 HISTORY — DX: Generalized anxiety disorder: F41.1

## 2023-05-25 HISTORY — DX: Sleep apnea, unspecified: G47.30

## 2023-05-25 HISTORY — DX: Depression, unspecified: F32.A

## 2023-05-25 LAB — CBC WITH DIFFERENTIAL/PLATELET
Basophils Absolute: 0 10*3/uL (ref 0.0–0.1)
Basophils Relative: 0.6 % (ref 0.0–3.0)
Eosinophils Absolute: 0.1 10*3/uL (ref 0.0–0.7)
Eosinophils Relative: 2 % (ref 0.0–5.0)
HCT: 45 % (ref 36.0–46.0)
Hemoglobin: 14.9 g/dL (ref 12.0–15.0)
Lymphocytes Relative: 36.4 % (ref 12.0–46.0)
Lymphs Abs: 2.6 10*3/uL (ref 0.7–4.0)
MCHC: 33.1 g/dL (ref 30.0–36.0)
MCV: 84.8 fl (ref 78.0–100.0)
Monocytes Absolute: 0.5 10*3/uL (ref 0.1–1.0)
Monocytes Relative: 7 % (ref 3.0–12.0)
Neutro Abs: 3.9 10*3/uL (ref 1.4–7.7)
Neutrophils Relative %: 54 % (ref 43.0–77.0)
Platelets: 249 10*3/uL (ref 150.0–400.0)
RBC: 5.31 Mil/uL — ABNORMAL HIGH (ref 3.87–5.11)
RDW: 13.9 % (ref 11.5–15.5)
WBC: 7.2 10*3/uL (ref 4.0–10.5)

## 2023-05-25 LAB — COMPREHENSIVE METABOLIC PANEL
ALT: 16 U/L (ref 0–35)
AST: 18 U/L (ref 0–37)
Albumin: 4.2 g/dL (ref 3.5–5.2)
Alkaline Phosphatase: 101 U/L (ref 39–117)
BUN: 14 mg/dL (ref 6–23)
CO2: 27 meq/L (ref 19–32)
Calcium: 9.1 mg/dL (ref 8.4–10.5)
Chloride: 103 meq/L (ref 96–112)
Creatinine, Ser: 0.65 mg/dL (ref 0.40–1.20)
GFR: 93.59 mL/min (ref >60.00)
Glucose, Bld: 87 mg/dL (ref 70–99)
Potassium: 3.4 meq/L — ABNORMAL LOW (ref 3.5–5.1)
Sodium: 141 meq/L (ref 135–145)
Total Bilirubin: 0.8 mg/dL (ref 0.2–1.2)
Total Protein: 7.3 g/dL (ref 6.0–8.3)

## 2023-05-25 LAB — URINALYSIS, ROUTINE W REFLEX MICROSCOPIC
Bilirubin Urine: NEGATIVE
Hgb urine dipstick: NEGATIVE
Ketones, ur: NEGATIVE
Nitrite: NEGATIVE
Specific Gravity, Urine: 1.025 (ref 1.000–1.030)
Total Protein, Urine: NEGATIVE
Urine Glucose: NEGATIVE
Urobilinogen, UA: 0.2 (ref 0.0–1.0)
pH: 6 (ref 5.0–8.0)

## 2023-05-25 LAB — TROPONIN I (HIGH SENSITIVITY): High Sens Troponin I: 6 ng/L (ref 2–17)

## 2023-05-25 LAB — VITAMIN B12: Vitamin B-12: 213 pg/mL (ref 211–911)

## 2023-05-25 LAB — LIPID PANEL
Cholesterol: 181 mg/dL (ref 0–200)
HDL: 65.6 mg/dL (ref >39.00)
LDL Cholesterol: 101 mg/dL — ABNORMAL HIGH (ref 0–99)
NonHDL: 115.35
Total CHOL/HDL Ratio: 3
Triglycerides: 70 mg/dL (ref 0.0–149.0)
VLDL: 14 mg/dL (ref 0.0–40.0)

## 2023-05-25 LAB — VITAMIN D 25 HYDROXY (VIT D DEFICIENCY, FRACTURES): VITD: 35.7 ng/mL (ref 30.00–100.00)

## 2023-05-25 LAB — MAGNESIUM: Magnesium: 1.7 mg/dL (ref 1.5–2.5)

## 2023-05-25 LAB — FOLATE: Folate: 13 ng/mL (ref >5.9)

## 2023-05-25 LAB — T4, FREE: Free T4: 1.02 ng/dL (ref 0.60–1.60)

## 2023-05-25 LAB — HEMOGLOBIN A1C: Hgb A1c MFr Bld: 5.6 % (ref 4.6–6.5)

## 2023-05-25 LAB — TSH: TSH: 0.99 u[IU]/mL (ref 0.35–5.50)

## 2023-05-25 LAB — BRAIN NATRIURETIC PEPTIDE: Pro B Natriuretic peptide (BNP): 21 pg/mL (ref 0.0–100.0)

## 2023-05-25 MED ORDER — VALSARTAN-HYDROCHLOROTHIAZIDE 160-25 MG PO TABS: 1.0000 | ORAL_TABLET | Freq: Every day | ORAL | 2 refills | Status: DC

## 2023-05-25 MED ORDER — POTASSIUM CHLORIDE CRYS ER 10 MEQ PO TBCR
10.0000 meq | EXTENDED_RELEASE_TABLET | Freq: Every day | ORAL | 1 refills | Status: AC
Start: 2023-05-25 — End: ?

## 2023-05-25 NOTE — Assessment & Plan Note (Signed)
 Continue bariatric MVI

## 2023-05-25 NOTE — Assessment & Plan Note (Signed)
Check labs to look for underlying complications. Consider GLP-1 in future.

## 2023-05-25 NOTE — Progress Notes (Signed)
New Patient Office Visit  Subjective    Patient ID: Kathryn [name], female    DOB: [birth year]  Age: 62 y.o. MRN: [id number]  CC:  Chief Complaint  Patient presents with   Establish Care    Feels like she has been having a lot of heart palpitations and feeling really tired to the point she has to lay down. Wants her thyroid checked because hasn't been checked since COVID and usually does every year.     HPI Kathryn [name] presents to establish care Previous PCP- not in the past 4-5 years.   Hx of bariatric surgery in 2017 at [facility]   Regained weight since. BP uncontrolled.   Hx of HTN which was controlled without medication after weight loss surgery but recently elevated. Off of medication.   Denies dizziness, headache, chest pain, shortness of breath.   C/o palpitations and severe fatigue x 1 month.   Hx of cardiac cath in [place] in 2004. States she actually had altitude sickness.   Overdue for pap smear and mammogram.   Colonoscopy - [facility] 2017  Divorced. Involved in her church   Outpatient Encounter Medications as of 05/25/2023  Medication Sig   ibuprofen (ADVIL) 800 MG tablet Take 800 mg by mouth 3 (three) times daily as needed.   Multiple Vitamin (MULTIVITAMIN WITH MINERALS) TABS tablet Take 1 tablet by mouth daily.   omeprazole (PRILOSEC) 20 MG capsule Take 20 mg by mouth daily.   [DISCONTINUED] valsartan-hydrochlorothiazide (DIOVAN HCT) 160-25 MG per tablet Take 1 tablet by mouth daily.   valsartan-hydrochlorothiazide (DIOVAN HCT) 160-25 MG tablet Take 1 tablet by mouth daily.   [DISCONTINUED] acetaminophen-codeine (TYLENOL #3) 300-30 MG tablet Take 1 tablet by mouth every 6 (six) hours as needed for moderate pain.   [DISCONTINUED] BIOTIN PO Take 1 tablet by mouth daily.   [DISCONTINUED] Calcium Carb-Cholecalciferol (CALCIUM 500+D PO) Take 2 tablets by mouth daily. chews   [DISCONTINUED] cholecalciferol (VITAMIN D) 1000 units tablet Take 2,000 Units by mouth daily.    [DISCONTINUED] Cyanocobalamin (B-12) 1000 MCG SUBL Place 1,000 mcg under the tongue daily.   [DISCONTINUED] Polyvinyl Alcohol-Povidone (REFRESH OP) Apply 2 drops to eye daily.   [DISCONTINUED] chlorhexidine (HIBICLENS) 4 % liquid    No facility-administered encounter medications on file as of 05/25/2023.    Past Medical History:  Diagnosis Date   Accessory skin tags    Allergy 1990's   penicillin   Anxiety state 05/25/2023   Apnea, sleep 05/25/2023   Arthritis    lower back   Clinical depression 05/25/2023   Complication of anesthesia 12/2015   block paralized right diaphram - sats dropped shoulder surgery    Cough 10/24/2014   Try off acei 10/23/14 >>>   11/20/14 CT sinus >neg        Last Assessment & Plan:    Resistant cough on ACE inhibitor   Would avoid ACE inhibitor's in the future if possible   Advised on cough control   Check CT sinus     GERD (gastroesophageal reflux disease)    No longer since gastric sleeve   Goiter    Headache    migraines as a teenager   History of kidney stones     62 years old   Hypertension    not current- normal since gastric sleeve   OSA (obstructive sleep apnea)    Overactive bladder    Overactive bladder    Palpitations    Pneumonia    2005 ish, 2016  PONV (postoperative nausea and vomiting)    Thyroid nodule     Past Surgical History:  Procedure Laterality Date   ANTERIOR CERVICAL DECOMP/DISCECTOMY FUSION Right 04/07/2017   Procedure: ANTERIOR CERVICAL DECOMPRESSION FUSION, CERVICAL 5-6, CERVICAL 6-7 WITH INSTRUMENTATION AND ALLOGRAFT;  Surgeon: Estill Bamberg, MD;  Location: [facility] OR;  Service: Orthopedics;  Laterality: Right;  ANTERIOR CERVICAL DECOMPRESSION FUSION, CERVICAL 5-6, CERVICAL 6-7 WITH INSTRUMENTATION AND ALLOGRAFT; REQUEST 2.5 HOURS   BTL  1983   CARDIAC CATHETERIZATION     CHOLECYSTECTOMY     CHOLECYSTECTOMY, LAPAROSCOPIC  05/2002   COLONOSCOPY     ESOPHAGOGASTRODUODENOSCOPY     KIDNEY SURGERY     age 84- 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   shoulder surgery Right 1997    x 3 Arthroscopy   TUBAL LIGATION      Family History  Problem Relation Age of Onset   COPD Father        smoked   Cancer - Colon Father    Cancer Father    Other Mother        GASTRIC STAPLING   Gallbladder disease Mother    Early death Mother    Obesity Mother    Gallbladder disease Maternal Grandfather        GASTRIC STAPLING   Diabetes Maternal Grandfather    Early death Sister    Obesity Sister    Obesity Sister    Birth defects Brother    Colon polyps Maternal Grandmother    Cirrhosis Other    Leukemia Brother        has one kidney   Leukemia Sister    Coronary artery disease Sister        X3   Hypertension Other    Migraines Other    Fibroids Sister        3X   Birth defects Daughter    Varicose Veins Daughter    Diabetes Maternal Uncle    Breast cancer Neg Hx     Social History   Socioeconomic History   Marital status: Divorced    Spouse name: Not on file   Number of children: Not on file   Years of education: Not on file   Highest education level: Not on file  Occupational History   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 MY HUSBAND LAST PERSON  Other Topics Concern   Not on file  Social History Narrative   Not on file   Social Determinants of Health   Financial Resource Strain: Not on file   Food Insecurity: Not on file  Transportation Needs: Not on file  Physical Activity: Not on file  Stress: Not on file  Social Connections: Not on file  Intimate Partner Violence: Not on file    Review of Systems  Constitutional:  Positive for malaise/fatigue. Negative for chills, fever and weight loss.  Respiratory:  Negative for shortness of breath.   Cardiovascular:  Positive for palpitations. Negative for chest pain and leg swelling.  Gastrointestinal:  Negative for abdominal pain, constipation, diarrhea, nausea and vomiting.  Neurological:  Negative for dizziness and focal weakness.        Objective    BP (!) 148/100 (BP Location: Left Arm, Patient Position: Sitting, Cuff Size: Large)   Pulse 76   Temp (!) 97.3 F (36.3 C) (Temporal)   Ht 5\' 2"  (1.575 m)   Wt 222 lb (100.7 kg)   SpO2 97%   BMI 40.60 kg/m   Physical Exam Constitutional:      General: She is not in acute distress.    Appearance: She is obese. She is not ill-appearing.  HENT:     Mouth/Throat:     Mouth: Mucous membranes are moist.     Pharynx: Oropharynx is clear.  Eyes:     Extraocular Movements: Extraocular movements intact.     Conjunctiva/sclera: Conjunctivae normal.  Cardiovascular:     Rate and Rhythm: Normal rate and regular rhythm.  Pulmonary:     Effort: Pulmonary effort is normal.     Breath sounds: Normal breath sounds.  Musculoskeletal:     Cervical back: Normal range of motion and neck supple. No tenderness.  Lymphadenopathy:     Cervical: No cervical adenopathy.  Skin:    General: Skin is warm and dry.  Neurological:     General: No focal deficit present.     Mental Status: She is alert and oriented to person, place, and time.     Motor: No weakness.     Coordination: Coordination normal.  Psychiatric:        Mood and Affect: Mood normal.        Behavior: Behavior normal.        Thought Content: Thought content normal.         Assessment & Plan:   Problem List  Items Addressed This Visit       Cardiovascular and Mediastinum   Essential hypertension    Uncontrolled HTN. No recent PCP or medication. Restart valsartan- hydrochlorothiazide. Monitor BP at home. DASH handout.  Check labs to look for target end organ damage.  CXR and UA  Hx of cough with acei.       Relevant Medications   valsartan-hydrochlorothiazide (DIOVAN HCT) 160-25 MG tablet   Other Relevant Orders   CBC with Differential/Platelet   Comprehensive metabolic panel   DG Chest 2 View (Completed)   Urinalysis, Routine w reflex microscopic   EKG 12-Lead     Other   Fatigue - Primary    Check labs, chest XR, UA to look for underlying etiology.  Recent weight gain contributing.       Relevant Orders   CBC with Differential/Platelet   Comprehensive metabolic panel   TSH   T4, free   Vitamin B12   VITAMIN D 25 Hydroxy (Vit-D Deficiency, Fractures)   Iron, TIBC and Ferritin Panel   Folate   Troponin I (High Sensitivity)   Brain natriuretic peptide   DG Chest 2 View (Completed)   History of bariatric surgery    Continue bariatric MVI      Relevant Orders   Vitamin B12   Magnesium   Iron, TIBC and Ferritin Panel   Folate   Palpitations    EKG shows NSR, rate 66, 1st degree AV block (seen on  2018 EKG), no acute changes       Relevant Orders   CBC with Differential/Platelet   Comprehensive metabolic panel   TSH   T4, free   Iron, TIBC and Ferritin Panel   Troponin I (High Sensitivity)   Brain natriuretic peptide   DG Chest 2 View (Completed)   EKG 12-Lead   Severe obesity (BMI >= 40) (HCC)    Check labs to look for underlying complications. Consider GLP-1 in future.       Relevant Orders   Lipid panel   TSH   T4, free   Hemoglobin A1c   Vitamin D deficiency    Follow up pending vitamin D results.       Relevant Orders   VITAMIN D 25 Hydroxy (Vit-D Deficiency, Fractures)   Other Visit Diagnoses     Encounter to establish care       Screening  for cervical cancer       Relevant Orders   Ambulatory referral to Gynecology         Return in about 4 weeks (around 06/22/2023).   Hetty Blend, NP-C

## 2023-05-25 NOTE — Assessment & Plan Note (Signed)
Check labs, chest XR, UA to look for underlying etiology.  Recent weight gain contributing.

## 2023-05-25 NOTE — Patient Instructions (Addendum)
Please go downstairs for labs, urine test and chest X ray.   Restart your blood pressure medication.   Reduce sodium intake. See the DASH handout.   Monitor BP at home for the next 4 weeks and bring in the readings to your follow up visit in 4 weeks.   You will hear from a gynecology office to schedule a visit for your female health.

## 2023-05-25 NOTE — Assessment & Plan Note (Signed)
EKG shows NSR, rate 66, 1st degree AV block (seen on 2018 EKG), no acute changes

## 2023-05-25 NOTE — Assessment & Plan Note (Signed)
Follow up pending vitamin D results.

## 2023-05-25 NOTE — Assessment & Plan Note (Signed)
Uncontrolled HTN. No recent PCP or medication. Restart valsartan- hydrochlorothiazide. Monitor BP at home. DASH handout.  Check labs to look for target end organ damage.  CXR and UA  Hx of cough with acei.

## 2023-05-26 LAB — IRON,TIBC AND FERRITIN PANEL
%SAT: 28 % (ref 16–45)
Ferritin: 20 ng/mL (ref 16–288)
Iron: 118 ug/dL (ref 45–160)
TIBC: 428 ug/dL (ref 250–450)

## 2023-06-22 ENCOUNTER — Ambulatory Visit: Payer: Managed Care, Other (non HMO) | Admitting: Family Medicine

## 2023-06-22 ENCOUNTER — Encounter: Payer: Self-pay | Admitting: Family Medicine

## 2023-06-22 VITALS — BP 134/96 | HR 82 | Temp 97.6°F | Ht 62.0 in | Wt 224.0 lb

## 2023-06-22 DIAGNOSIS — R5383 Other fatigue: Secondary | ICD-10-CM | POA: Diagnosis not present

## 2023-06-22 DIAGNOSIS — E876 Hypokalemia: Secondary | ICD-10-CM

## 2023-06-22 DIAGNOSIS — R400 Somnolence: Secondary | ICD-10-CM

## 2023-06-22 DIAGNOSIS — E042 Nontoxic multinodular goiter: Secondary | ICD-10-CM | POA: Diagnosis not present

## 2023-06-22 DIAGNOSIS — I10 Essential (primary) hypertension: Secondary | ICD-10-CM

## 2023-06-22 DIAGNOSIS — I517 Cardiomegaly: Secondary | ICD-10-CM

## 2023-06-22 DIAGNOSIS — Z9189 Other specified personal risk factors, not elsewhere classified: Secondary | ICD-10-CM | POA: Insufficient documentation

## 2023-06-22 DIAGNOSIS — Z23 Encounter for immunization: Secondary | ICD-10-CM | POA: Diagnosis not present

## 2023-06-22 DIAGNOSIS — Z8249 Family history of ischemic heart disease and other diseases of the circulatory system: Secondary | ICD-10-CM

## 2023-06-22 DIAGNOSIS — R002 Palpitations: Secondary | ICD-10-CM

## 2023-06-22 LAB — BASIC METABOLIC PANEL
BUN: 18 mg/dL (ref 6–23)
CO2: 27 meq/L (ref 19–32)
Calcium: 9.8 mg/dL (ref 8.4–10.5)
Chloride: 105 meq/L (ref 96–112)
Creatinine, Ser: 0.64 mg/dL (ref 0.40–1.20)
GFR: 93.89 mL/min (ref >60.00)
Glucose, Bld: 89 mg/dL (ref 70–99)
Potassium: 4.2 meq/L (ref 3.5–5.1)
Sodium: 139 meq/L (ref 135–145)

## 2023-06-22 NOTE — Assessment & Plan Note (Signed)
Consider GLP-1 and referral to [facility]

## 2023-06-22 NOTE — Patient Instructions (Addendum)
Call and schedule your mammogram.   You will hear about scheduling with sleep study to screen for sleep apnea.   Check with your insurance and see if Wegovy or Zepbound would be covered for weight loss.   Once I have your sleep study and thyroid ultrasound results, I would like to see you back in the office.

## 2023-06-22 NOTE — Progress Notes (Signed)
Subjective:     Patient ID: Kathryn [name], female    DOB: [birth year], 62 y.o.   MRN: [id number]  Chief Complaint  Patient presents with   Medical Management of Chronic Issues    4 week f/u    HPI  Discussed the use of AI scribe software for clinical note transcription with the patient, who gave verbal consent to proceed.  History of Present Illness          States she had an episode of feeling tired, dizzy and BP was 95/53   In general her BP at home has improved and some in goal range.   Father had MI in 65s.  Grandfather died from MI in his 89s.  Sister with heart murmur.   States she wakes up often at night. Does not feel rested.   History of thyroid nodules with last thyroid US in 2017. States she was advised to have a thyroid US yearly. No known history of cancer.    Health Maintenance Due  Topic Date Due   HIV Screening  Never done   Hepatitis C Screening  Never done   Colonoscopy  Never done   Zoster Vaccines- Shingrix (1 of 2) Never done   Cervical Cancer Screening (HPV/Pap Cotest)  12/15/2018   MAMMOGRAM  11/26/2022   DTaP/Tdap/Td (2 - Td or Tdap) 05/17/2023    Past Medical History:  Diagnosis Date   Accessory skin tags    Allergy 1990's   penicillin   Anxiety state 05/25/2023   Apnea, sleep 05/25/2023   Arthritis    lower back   Clinical depression 05/25/2023   Complication of anesthesia 12/2015   block paralized right diaphram - sats dropped shoulder surgery    Cough 10/24/2014   Try off acei 10/23/14 >>>   11/20/14 CT sinus >neg        Last Assessment & Plan:    Resistant cough on ACE inhibitor   Would avoid ACE inhibitor's in the future if possible   Advised on cough control   Check CT sinus     GERD (gastroesophageal reflux disease)    No longer since gastric sleeve   Goiter    Headache    migraines as a teenager   History of kidney stones     62 years old   Hypertension    not current- normal since gastric sleeve   OSA (obstructive  sleep apnea)    Overactive bladder    Overactive bladder    Palpitations    Pneumonia    2005 ish, 2016   PONV (postoperative nausea and vomiting)    Thyroid nodule     Past Surgical History:  Procedure Laterality Date   ANTERIOR CERVICAL DECOMP/DISCECTOMY FUSION Right 04/07/2017   Procedure: ANTERIOR CERVICAL DECOMPRESSION FUSION, CERVICAL 5-6, CERVICAL 6-7 WITH INSTRUMENTATION AND ALLOGRAFT;  Surgeon: Estill Bamberg, MD;  Location: [facility] OR;  Service: Orthopedics;  Laterality: Right;  ANTERIOR CERVICAL DECOMPRESSION FUSION, CERVICAL 5-6, CERVICAL 6-7 WITH INSTRUMENTATION AND ALLOGRAFT; REQUEST 2.5 HOURS   BTL  1983   CARDIAC CATHETERIZATION     CHOLECYSTECTOMY     CHOLECYSTECTOMY, LAPAROSCOPIC  05/2002   COLONOSCOPY     ESOPHAGOGASTRODUODENOSCOPY     KIDNEY SURGERY     age 75- 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   shoulder surgery Right 1997    x 3 Arthroscopy   TUBAL LIGATION  Family History  Problem Relation Age of Onset   COPD Father        smoked   Cancer - Colon Father    Cancer Father    Other Mother        GASTRIC STAPLING   Gallbladder disease Mother    Early death Mother    Obesity Mother    Gallbladder disease Maternal Grandfather        GASTRIC STAPLING   Diabetes Maternal Grandfather    Early death Sister    Obesity Sister    Obesity Sister    Birth defects Brother    Colon polyps Maternal Grandmother    Cirrhosis Other    Leukemia Brother        has one kidney   Leukemia Sister    Coronary artery disease Sister        X3   Hypertension Other    Migraines Other    Fibroids Sister        3X   Birth defects Daughter    Varicose Veins Daughter    Diabetes Maternal Uncle    Breast cancer Neg Hx     Social History   Socioeconomic History   Marital status: Divorced    Spouse name: Not on file   Number of children: Not on file   Years  of education: Not on file   Highest education level: Not on file  Occupational History   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 MY HUSBAND LAST PERSON  Other Topics Concern   Not on file  Social History Narrative   Not on file   Social Determinants of Health   Financial Resource Strain: Not on file  Food Insecurity: Not on file  Transportation Needs: Not on file  Physical Activity: Not on file  Stress: Not on file  Social Connections: Not on file  Intimate Partner Violence: Not on file    Outpatient Medications Prior to Visit  Medication Sig Dispense Refill   ibuprofen (ADVIL) 800 MG tablet Take 800 mg by mouth 3 (three) times daily as needed.     Multiple Vitamin (MULTIVITAMIN WITH MINERALS) TABS tablet Take 1 tablet by mouth daily.     omeprazole (PRILOSEC) 20 MG capsule Take 20 mg by mouth daily.     potassium chloride (KLOR-CON M) 10 MEQ tablet Take 1 tablet (10 mEq total) by mouth daily. 30 tablet 1   valsartan-hydrochlorothiazide (DIOVAN HCT) 160-25 MG tablet Take 1 tablet by mouth daily. 30 tablet 2   No facility-administered medications prior to visit.    Allergies  Allergen Reactions   Iodinated Contrast Media Other (See Comments)    Unknown : childhood reaction    Metrizamide Other (See Comments)    Unknown : childhood reaction   Penicillins Hives    Has patient had a PCN reaction causing immediate rash, facial/tongue/throat swelling, SOB or lightheadedness with hypotension:  No Has patient had a PCN reaction causing severe rash involving mucus membranes or skin necrosis: No Has patient had a PCN reaction that required hospitalization: No Has patient had a PCN reaction occurring within the last 10 years: No If all of the above answers are "NO", then may proceed with Cephalosporin use.     Review of Systems  Constitutional:  Negative for chills and fever.  Respiratory:  Negative for shortness of breath.   Cardiovascular:  Negative for chest pain, palpitations and leg swelling.  Gastrointestinal:  Negative for abdominal pain, constipation, diarrhea, nausea and vomiting.  Genitourinary:  Negative for dysuria, frequency and urgency.  Neurological:  Negative for dizziness and focal weakness.       Objective:    Physical Exam Constitutional:      General: She is not in acute distress.    Appearance: She is not ill-appearing.  Eyes:     Extraocular Movements: Extraocular movements intact.     Conjunctiva/sclera: Conjunctivae normal.  Cardiovascular:     Rate and Rhythm: Normal rate.  Pulmonary:     Effort: Pulmonary effort is normal.  Musculoskeletal:     Cervical back: Normal range of motion and neck supple.  Skin:    General: Skin is warm and dry.  Neurological:     General: No focal deficit present.     Mental Status: She is alert and oriented to person, place, and time.  Psychiatric:        Mood and Affect: Mood normal.        Behavior: Behavior normal.        Thought Content: Thought content normal.      BP (!) 134/96 (BP Location: Left Arm, Patient Position: Sitting, Cuff Size: Large)   Pulse 82   Temp 97.6 F (36.4 C) (Temporal)   Ht 5\' 2"  (1.575 m)   Wt 224 lb (101.6 kg)   SpO2 98%   BMI 40.97 kg/m  Wt Readings from Last 3 Encounters:  06/22/23 224 lb (101.6 kg)  05/25/23 222 lb (100.7 kg)  08/22/22 210 lb (95.3 kg)       Assessment & Plan:   Problem List Items Addressed This Visit       Cardiovascular and Mediastinum    Cardiomegaly    Stable. Refer to cardiology       Relevant Orders   Ambulatory referral to Cardiology   Essential hypertension - Primary    Continue current medication. Monitor BP at home. Low sodium diet. Suspect sleep apnea. Sleep test ordered.       Relevant Orders   Basic metabolic panel (Completed)   Ambulatory referral to Cardiology     Endocrine   Multinodular goiter    Overdue for thyroid US. Ordered test.       Relevant Orders   US THYROID     Other   At risk for sleep apnea   Relevant Orders   PSG SLEEP STUDY   Daytime somnolence    Sleep study ordered       Relevant Orders   PSG SLEEP STUDY   Fatigue   Relevant Orders   PSG SLEEP STUDY   Palpitations    Persistent. Referral to cardiologist.       Relevant Orders   US THYROID   Ambulatory referral to Cardiology   Severe obesity (BMI >= 40) (HCC)    Consider GLP-1 and referral to [facility]      Other Visit Diagnoses     Hypokalemia  Relevant Orders   Basic metabolic panel (Completed)   Family history of ischemic heart disease (IHD)       Relevant Orders   Ambulatory referral to Cardiology   Need for influenza vaccination       Relevant Orders   Flu vaccine trivalent PF, 6mos and older(Flulaval,Afluria,Fluarix,Fluzone) (Completed)       I am having Kathryn [name] maintain her multivitamin with minerals, ibuprofen, omeprazole, valsartan-hydrochlorothiazide, and potassium chloride.  No orders of the defined types were placed in this encounter.

## 2023-06-22 NOTE — Assessment & Plan Note (Signed)
Sleep study ordered

## 2023-06-22 NOTE — Assessment & Plan Note (Signed)
Overdue for thyroid US. Ordered test.

## 2023-06-22 NOTE — Assessment & Plan Note (Signed)
Continue current medication. Monitor BP at home. Low sodium diet. Suspect sleep apnea. Sleep test ordered.

## 2023-06-22 NOTE — Assessment & Plan Note (Signed)
Persistent. Referral to cardiologist.

## 2023-06-22 NOTE — Assessment & Plan Note (Signed)
Stable  Refer to cardiology

## 2023-06-28 ENCOUNTER — Ambulatory Visit
Admission: RE | Admit: 2023-06-28 | Discharge: 2023-06-28 | Disposition: A | Discharge status: Home / Self Care | Payer: Managed Care, Other (non HMO) | Source: Ambulatory Visit | Attending: Family Medicine | Admitting: Family Medicine

## 2023-06-28 DIAGNOSIS — R002 Palpitations: Secondary | ICD-10-CM

## 2023-06-28 DIAGNOSIS — E042 Nontoxic multinodular goiter: Secondary | ICD-10-CM

## 2023-06-30 ENCOUNTER — Other Ambulatory Visit: Payer: Self-pay | Admitting: Family Medicine

## 2023-06-30 DIAGNOSIS — Z1231 Encounter for screening mammogram for malignant neoplasm of breast: Secondary | ICD-10-CM

## 2023-07-07 ENCOUNTER — Other Ambulatory Visit (HOSPITAL_COMMUNITY)
Admission: RE | Admit: 2023-07-07 | Discharge: 2023-07-07 | Disposition: A | Discharge status: Home / Self Care | Payer: Managed Care, Other (non HMO) | Source: Ambulatory Visit | Attending: Nurse Practitioner | Admitting: Nurse Practitioner

## 2023-07-07 ENCOUNTER — Encounter: Payer: Self-pay | Admitting: Nurse Practitioner

## 2023-07-07 ENCOUNTER — Ambulatory Visit (INDEPENDENT_AMBULATORY_CARE_PROVIDER_SITE_OTHER): Payer: Commercial Managed Care - HMO | Admitting: Nurse Practitioner

## 2023-07-07 VITALS — BP 134/82 | HR 79 | Ht 61.0 in | Wt 224.0 lb

## 2023-07-07 DIAGNOSIS — Z124 Encounter for screening for malignant neoplasm of cervix: Secondary | ICD-10-CM

## 2023-07-07 DIAGNOSIS — Z780 Asymptomatic menopausal state: Secondary | ICD-10-CM

## 2023-07-07 DIAGNOSIS — Z01419 Encounter for gynecological examination (general) (routine) without abnormal findings: Secondary | ICD-10-CM

## 2023-07-07 NOTE — Progress Notes (Signed)
   LOREA KUPFER 08/13/1961 [id number]   History:  62 y.o. [id number] presents as new patient to establish care.No GYN complaints. Postmenopausal - no HRT, no bleeding. Normal pap history. GERD, HTN, thyroid nodules managed by PCP.   Gynecologic History No LMP recorded. Patient is postmenopausal.   Contraception/Family planning: post menopausal status Sexually active: No  Health Maintenance Last Pap: 12/15/2015. Results were: Normal neg HPV Last mammogram: 11/25/2020. Results were: Normal Last colonoscopy: 2018, 5-year recall Last Dexa: Not indicated  Past medical history, past surgical history, family history and social history were all reviewed and documented in the EPIC chart. 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 Daughter lives local, has 8 yo son, 87 yo daughter.   ROS:  A ROS was performed and pertinent positives and negatives are included.  Exam:  Vitals:   07/07/23 0850  BP: 134/82  Pulse: 79  SpO2: 98%  Weight: 224 lb (101.6 kg)  Height: 5\' 1"  (1.549 m)   Body mass index is 42.32 kg/m.  General appearance:  Normal Thyroid:  Symmetrical, normal in size, without palpable masses or nodularity. Respiratory  Auscultation:  Clear without wheezing or rhonchi Cardiovascular  Auscultation:  Regular rate, without rubs, murmurs or gallops  Edema/varicosities:  Not grossly evident Abdominal  Soft,nontender, without masses, guarding or rebound.  Liver/spleen:  No organomegaly noted  Hernia:  None appreciated  Skin  Inspection:  Grossly normal Breasts: Examined lying and sitting.   Right: Without masses, retractions, nipple discharge or axillary adenopathy.   Left: Without masses, retractions, nipple discharge or axillary adenopathy. Pelvic: External genitalia:  no lesions              Urethra:  normal appearing urethra with no masses, tenderness or lesions              Bartholins and Skenes: normal                 Vagina: normal appearing vagina with normal color and discharge, no  lesions. Atrophic changes              Cervix: no lesions Bimanual Exam:  Uterus:  no masses or tenderness              Adnexa: no mass, fullness, tenderness              Rectovaginal: Deferred              Anus:  normal, no lesions  Patient informed chaperone available to be present for breast and pelvic exam. Patient has requested no chaperone to be present. Patient has been advised what will be completed during breast and pelvic exam.   Assessment/Plan:  62 y.o. [id number] to establish care.   Well female exam with routine gynecological exam - Education provided on SBEs, importance of preventative screenings, current guidelines, high calcium diet, regular exercise, and multivitamin daily. Labs with PCP.   Cervical cancer screening - Plan: Cytology - PAP( [place]). Normal pap history.   Postmenopausal - no HRT, no bleeding.   Screening for breast cancer - Normal mammogram history. Scheduled 07/21/23. Normal breast exam today.   Screening for colon cancer - 2018 colonoscopy. Due now and encouraged to schedule.   Screening for osteoporosis - Average risk. Will plan DXA at age 31.   Return in about 1 year (around 07/06/2024) for Annual.    Olivia Mackie DNP, 9:17 AM 07/07/2023

## 2023-07-08 ENCOUNTER — Telehealth: Payer: Self-pay | Admitting: Family Medicine

## 2023-07-08 NOTE — Telephone Encounter (Signed)
Patient called and said that she has not heard anything from anyone about the sleep apnea.  I don't see a referral to pulmonology - please call patient and advise.

## 2023-07-11 LAB — CYTOLOGY - PAP
Comment: NEGATIVE
Diagnosis: NEGATIVE
High risk HPV: NEGATIVE

## 2023-07-12 NOTE — Telephone Encounter (Signed)
I see PSG sleep study placed 06/22/23, is this a different order than normal? Just trying to understand as I do not see where [facility] would be contacting

## 2023-07-12 NOTE — Telephone Encounter (Signed)
Patient called back again about her sleep study order.  Please call patient and advise

## 2023-07-21 ENCOUNTER — Ambulatory Visit
Admission: RE | Admit: 2023-07-21 | Discharge: 2023-07-21 | Disposition: A | Discharge status: Home / Self Care | Payer: Managed Care, Other (non HMO) | Source: Ambulatory Visit | Attending: Family Medicine | Admitting: Family Medicine

## 2023-07-21 DIAGNOSIS — Z1231 Encounter for screening mammogram for malignant neoplasm of breast: Secondary | ICD-10-CM

## 2023-08-10 ENCOUNTER — Other Ambulatory Visit: Payer: Self-pay | Admitting: Orthopaedic Surgery

## 2023-08-10 DIAGNOSIS — M25561 Pain in right knee: Secondary | ICD-10-CM

## 2023-08-24 ENCOUNTER — Ambulatory Visit: Payer: Commercial Managed Care - HMO | Attending: Cardiology | Admitting: Cardiology

## 2023-08-24 ENCOUNTER — Encounter: Payer: Self-pay | Admitting: Cardiology

## 2023-08-24 VITALS — BP 134/94 | HR 88 | Resp 16 | Ht 61.0 in | Wt 225.0 lb

## 2023-08-24 DIAGNOSIS — R002 Palpitations: Secondary | ICD-10-CM | POA: Diagnosis not present

## 2023-08-24 DIAGNOSIS — Z8249 Family history of ischemic heart disease and other diseases of the circulatory system: Secondary | ICD-10-CM

## 2023-08-24 DIAGNOSIS — I499 Cardiac arrhythmia, unspecified: Secondary | ICD-10-CM

## 2023-08-24 DIAGNOSIS — I10 Essential (primary) hypertension: Secondary | ICD-10-CM

## 2023-08-24 NOTE — Patient Instructions (Signed)
Medication Instructions:   Your physician recommends that you continue on your current medications as directed. Please refer to the Current Medication list given to you today.  *If you need a refill on your cardiac medications before your next appointment, please call your pharmacy*    Testing/Procedures:  Your physician has requested that you have an echocardiogram. Echocardiography is a painless test that uses sound waves to create images of your heart. It provides your doctor with information about the size and shape of your heart and how well your heart's chambers and valves are working. This procedure takes approximately one hour. There are no restrictions for this procedure. Please do NOT wear cologne, perfume, aftershave, or lotions (deodorant is allowed). Please arrive 15 minutes prior to your appointment time.  Please note: We ask at that you not bring children with you during ultrasound (echo/ vascular) testing. Due to room size and safety concerns, children are not allowed in the ultrasound rooms during exams. Our front office staff cannot provide observation of children in our lobby area while testing is being conducted. An adult accompanying a patient to their appointment will only be allowed in the ultrasound room at the discretion of the ultrasound technician under special circumstances. We apologize for any inconvenience.    Your physician has recommended that you wear an event monitor. Event monitors are medical devices that record the heart's electrical activity. Doctors most often [place] these monitors to diagnose arrhythmias. Arrhythmias are problems with the speed or rhythm of the heartbeat. The monitor is a small, portable device. You can wear one while you do your normal daily activities. This is usually used to diagnose what is causing palpitations/syncope (passing out).  PLEASE MAKE AN APPOINTMENT TO HAVE THIS PLACED IN THE CLINIC BY MONITOR DEPARTMENT    CARDIAC CALCIUM  SCORE (SELF PAY)    Follow-Up:  3 MONTHS WITH AN EXTENDER IN THE OFFICE

## 2023-08-24 NOTE — Progress Notes (Addendum)
Cardiology Office Note:  .   Date:  08/24/2023  ID:  Kathryn [name], DOB 1961/02/10, MRN [id number] PCP: Avanell Shackleton, NP-C  [facility] Providers Cardiologist:  Truett Mainland, MD PCP: Avanell Shackleton, NP-C  Chief Complaint  Patient presents with   Palpitations   New Patient (Initial Visit)      History of Present Illness: Kathryn [name] is a 62 y.o. female with hypertension, OSA on CPAP, family [place] CAD, here with palpitations  Patient reports episodes of palpitation lasting for several minutes at a time, without any specific chest pain or shortness of breath symptoms.  Symptoms occur couple times a week.  Blood pressure has been elevated recently.  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  She admits to drinking multiple diet Cokes.  She is waiting for a new CPAP machine for treatment of her OSA.  She does not smoke, does not drink alcohol regularly.  She does not do any regular physical activity, mostly due to work-related stress.    On a separate note, patient's father had heart attack in early 67s.  Vitals:   08/24/23 1558 08/24/23 1616  BP: (!) 142/98 (!) 134/94  Pulse: 88   Resp: 16   SpO2: 98%      ROS:  Review of Systems  Cardiovascular:  Positive for irregular heartbeat and palpitations. Negative for chest pain, dyspnea on exertion, leg swelling and syncope.     Studies Reviewed: Marland Kitchen        EKG 08/24/2023: Normal sinus rhythm Normal ECG When compared with ECG of 29-Mar-2017 09:13, No significant change was found    Independently interpreted 05/2023: Chol 181, TG 70, HDL 65, LDL 101 HbA1C 5.6% Hb 14.9 Cr 0.6    Physical Exam:   Physical Exam Vitals and nursing note reviewed.  Constitutional:      General: She is not in acute distress.    Appearance: She is obese.  Neck:     Vascular: No JVD.  Cardiovascular:     Rate and Rhythm: Normal rate and regular rhythm.     Heart sounds:  Normal heart sounds. No murmur heard. Pulmonary:     Effort: Pulmonary effort is normal.     Breath sounds: Normal breath sounds. No wheezing or rales.  Musculoskeletal:     Right lower leg: No edema.     Left lower leg: No edema.      VISIT DIAGNOSES:   ICD-10-CM   1. Essential hypertension  I10 ECHOCARDIOGRAM COMPLETE    2. Palpitations  R00.2 EKG 12-Lead    Cardiac event monitor    3. Irregular heart beat  I49.9 Cardiac event monitor    4. Family history of early CAD  Z82.49 CT CARDIAC SCORING (SELF PAY ONLY)       ASSESSMENT AND PLAN: .    Kathryn [name] is a 62 y.o. female with hypertension, OSA on CPAP, family h/o CAD, here with palpitations  Palpitations, irregular heartbeat: Symptoms concerning for atrial fibrillation, especially given her risk factors of obesity and OSA. Recommend 30-day event monitor. Recommend reducing caffeine intake.  Hypertension: Blood pressure elevated today. Stress and caffeine intake could certainly be contributing.  Counseled regarding reducing both. No new medication added today. Recommend echocardiogram.  Family history of early CAD: Recommend CT cardiac scoring scan for risk stratification      F/u in 3 months  Signed, Elder Negus, MD

## 2023-08-30 ENCOUNTER — Encounter: Payer: Self-pay | Admitting: Orthopaedic Surgery

## 2023-08-31 ENCOUNTER — Encounter: Payer: Self-pay | Admitting: Orthopaedic Surgery

## 2023-09-03 ENCOUNTER — Ambulatory Visit
Admission: RE | Admit: 2023-09-03 | Discharge: 2023-09-03 | Disposition: A | Discharge status: Home / Self Care | Payer: Commercial Managed Care - HMO | Source: Ambulatory Visit | Attending: Orthopaedic Surgery | Admitting: Orthopaedic Surgery

## 2023-09-03 DIAGNOSIS — M25561 Pain in right knee: Secondary | ICD-10-CM

## 2023-09-16 ENCOUNTER — Telehealth: Payer: Self-pay | Admitting: *Deleted

## 2023-09-16 NOTE — Telephone Encounter (Signed)
   Pre-operative Risk Assessment    Patient Name: Kathryn [name]  DOB: [birth year] MRN: [id number]   Date of last office visit: 08/24/23 DR. PATWARDHAN Date of next office visit: 11/22/23 [name], [facility]   Request for Surgical Clearance    Procedure:  RIGHT TOTAL KNEE ARTHROPLASTY  Date of Surgery:  Clearance TBD                                Surgeon:  DR. Marcene Corning Surgeon's Group or Practice Name:  [facility] Phone number:  [phone] ATTN: REBECCA LANG Fax number:  [phone]   Type of Clearance Requested:   - Medical ; NONE INDICATED TO BE HELD   Type of Anesthesia:  Spinal   Additional requests/questions:    Elpidio Anis   09/16/2023, 12:33 PM

## 2023-09-18 NOTE — Telephone Encounter (Signed)
If surgery not urgent, would prefer above testing. If calcium score high, may even need stress testing.  Thanks MJP

## 2023-09-19 NOTE — Telephone Encounter (Addendum)
   Patient Name: Kathryn [name]  DOB: Mar 27, 1961 MRN: [id number]  Primary Cardiologist: None  Chart reviewed as part of pre-operative protocol coverage. Pre-op clearance already addressed by colleagues in earlier phone notes. To summarize recommendations:  - If surgery not urgent, would prefer above testing (cardiac CT).  If calcium score high, may even need stress testing.  -Dr. Rosemary Holms  Will route this bundled recommendation to requesting provider via Epic fax function and remove from pre-op pool. Please call with questions.  Sharlene Dory, PA-C 09/19/2023, 8:20 AM

## 2023-09-29 ENCOUNTER — Ambulatory Visit (INDEPENDENT_AMBULATORY_CARE_PROVIDER_SITE_OTHER): Payer: Commercial Managed Care - HMO

## 2023-09-29 ENCOUNTER — Ambulatory Visit: Payer: Commercial Managed Care - HMO | Attending: Cardiology

## 2023-09-29 DIAGNOSIS — R002 Palpitations: Secondary | ICD-10-CM | POA: Diagnosis not present

## 2023-09-29 DIAGNOSIS — I499 Cardiac arrhythmia, unspecified: Secondary | ICD-10-CM | POA: Diagnosis not present

## 2023-09-29 DIAGNOSIS — I10 Essential (primary) hypertension: Secondary | ICD-10-CM | POA: Insufficient documentation

## 2023-09-29 LAB — ECHOCARDIOGRAM COMPLETE
Area-P 1/2: 4.02 cm2
S' Lateral: 2.8 cm

## 2023-09-29 NOTE — Progress Notes (Unsigned)
 [employer] [id number] 30 day event monitor from office inventory applied to patient.  Dr. Rosemary Holms to read.

## 2023-10-17 ENCOUNTER — Ambulatory Visit (HOSPITAL_BASED_OUTPATIENT_CLINIC_OR_DEPARTMENT_OTHER): Payer: Commercial Managed Care - HMO

## 2023-11-07 ENCOUNTER — Ambulatory Visit (HOSPITAL_BASED_OUTPATIENT_CLINIC_OR_DEPARTMENT_OTHER)
Admission: RE | Admit: 2023-11-07 | Discharge: 2023-11-07 | Disposition: A | Discharge status: Home / Self Care | Payer: Self-pay | Source: Ambulatory Visit | Attending: Cardiology | Admitting: Cardiology

## 2023-11-07 DIAGNOSIS — Z8249 Family history of ischemic heart disease and other diseases of the circulatory system: Secondary | ICD-10-CM

## 2023-11-08 ENCOUNTER — Encounter: Payer: Self-pay | Admitting: Cardiology

## 2023-11-09 NOTE — Progress Notes (Signed)
 Cardiology Office Note:  .   Date:  11/22/2023  ID:  Kathryn [name], DOB 1960-12-05, MRN [id number] PCP: Avanell Shackleton, NP-C  [facility] Providers Cardiologist:  None    History of Present Illness: Kathryn [name] is a 63 y.o. female  with hypertension, OSA on CPAP, family history of CAD.  She saw Dr. Rosemary Holms 08/24/23 with palpitations. 30 day monitor ordered but she had allergic reactions and it was changed multiple times. She hasn't sent it back yet. Coronary CT calcium score was 0. Struggling with her cpap machine. Palpitations have improved. Has cut back on her diet coke. No regular exercise.   ROS:    Studies Reviewed: Marland Kitchen         Prior CV Studies: ECHO COMPLETE WO IMAGING ENHANCING AGENT 09/29/2023  Narrative ECHOCARDIOGRAM REPORT    Patient Name:   Kathryn [name] Date of Exam: 09/29/2023 Medical Rec #:  [id number]      Height:       61.0 in Accession #:    [id number]     Weight:       225.0 lb Date of Birth:  1961-07-24       BSA:          1.986 m Patient Age:    62 years       BP:           134/94 mmHg Patient Gender: F              HR:           78 bpm. Exam Location:  [address]  Procedure: 2D Echo, 3D Echo, Cardiac Doppler, Color Doppler and Strain Analysis  Indications:    I10 Hypertension  History:        Patient has no prior history of Echocardiogram examinations. Risk Factors:Hypertension. Obstructive sleep apnea-CPAP. Palpitations.  Sonographer:    Daphine Deutscher RDCS Referring Phys: [id number] [facility] PATWARDHAN  IMPRESSIONS   1. Left ventricular ejection fraction, by estimation, is 60 to 65%. The left ventricle has normal function. The left ventricle has no regional wall motion abnormalities. There is mild concentric left ventricular hypertrophy. Left ventricular diastolic parameters are consistent with Grade I diastolic dysfunction (impaired relaxation). The average left ventricular global longitudinal strain is -23.9 %. The global  longitudinal strain is normal. 2. Right ventricular systolic function is normal. The right ventricular size is normal. There is normal pulmonary artery systolic pressure. The estimated right ventricular systolic pressure is 22.5 mmHg. 3. The mitral valve is normal in structure. Trivial mitral valve regurgitation. No evidence of mitral stenosis. 4. The aortic valve is tricuspid. Aortic valve regurgitation is not visualized. Aortic valve sclerosis/calcification is present, without any evidence of aortic stenosis. 5. The inferior vena cava is normal in size with greater than 50% respiratory variability, suggesting right atrial pressure of 3 mmHg.  FINDINGS Left Ventricle: Left ventricular ejection fraction, by estimation, is 60 to 65%. The left ventricle has normal function. The left ventricle has no regional wall motion abnormalities. The average left ventricular global longitudinal strain is -23.9 %. The global longitudinal strain is normal. The left ventricular internal cavity size was normal in size. There is mild concentric left ventricular hypertrophy. Left ventricular diastolic parameters are consistent with Grade I diastolic dysfunction (impaired relaxation). Normal left ventricular filling pressure.  Right Ventricle: The right ventricular size is normal. No increase in right ventricular wall thickness. Right ventricular systolic function is normal. There is normal  pulmonary artery systolic pressure. The tricuspid regurgitant velocity is 2.21 m/s, and with an assumed right atrial pressure of 3 mmHg, the estimated right ventricular systolic pressure is 22.5 mmHg.  Left Atrium: Left atrial size was normal in size.  Right Atrium: Right atrial size was normal in size.  Pericardium: There is no evidence of pericardial effusion.  Mitral Valve: The mitral valve is normal in structure. Trivial mitral valve regurgitation. No evidence of mitral valve stenosis.  Tricuspid Valve: The tricuspid valve  is normal in structure. Tricuspid valve regurgitation is mild . No evidence of tricuspid stenosis.  Aortic Valve: The aortic valve is tricuspid. Aortic valve regurgitation is not visualized. Aortic valve sclerosis/calcification is present, without any evidence of aortic stenosis.  Pulmonic Valve: The pulmonic valve was normal in structure. Pulmonic valve regurgitation is trivial. No evidence of pulmonic stenosis.  Aorta: The aortic root is normal in size and structure.  Venous: The inferior vena cava is normal in size with greater than 50% respiratory variability, suggesting right atrial pressure of 3 mmHg.  IAS/Shunts: No atrial level shunt detected by color flow Doppler.   LEFT VENTRICLE PLAX 2D LVIDd:         4.60 cm   Diastology LVIDs:         2.80 cm   LV e' medial:    6.48 cm/s LV PW:         1.20 cm   LV E/e' medial:  8.5 LV IVS:        1.20 cm   LV e' lateral:   8.38 cm/s LVOT diam:     2.10 cm   LV E/e' lateral: 6.6 LV SV:         59 LV SV Index:   30        2D Longitudinal Strain LVOT Area:     3.46 cm  2D Strain GLS (A2C):   -23.6 % 2D Strain GLS (A3C):   -23.6 % 2D Strain GLS (A4C):   -24.5 % 2D Strain GLS Avg:     -23.9 %  3D Volume EF: 3D EF:        56 % LV EDV:       193 ml LV ESV:       84 ml LV SV:        109 ml  RIGHT VENTRICLE             IVC RV Basal diam:  3.90 cm     IVC diam: 1.00 cm RV S prime:     15.17 cm/s TAPSE (M-mode): 2.4 cm  LEFT ATRIUM             Index        RIGHT ATRIUM           Index LA diam:        4.10 cm 2.06 cm/m   RA Area:     12.50 cm LA Vol (A2C):   53.5 ml 26.94 ml/m  RA Volume:   29.80 ml  15.01 ml/m LA Vol (A4C):   47.9 ml 24.12 ml/m LA Biplane Vol: 51.1 ml 25.73 ml/m AORTIC VALVE LVOT Vmax:   79.40 cm/s LVOT Vmean:  53.200 cm/s LVOT VTI:    0.171 m  AORTA Ao Root diam: 3.55 cm Ao Asc diam:  3.60 cm  MITRAL VALVE               TRICUSPID VALVE MV Area (PHT): 4.02 cm    TR Peak  grad:   19.5 mmHg MV Decel Time:  189 msec    TR Vmax:        221.00 cm/s MV E velocity: 55.05 cm/s MV A velocity: 80.20 cm/s  SHUNTS MV E/A ratio:  0.69        Systemic VTI:  0.17 m Systemic Diam: 2.10 cm  Armanda Magic MD Electronically signed by Armanda Magic MD Signature Date/Time: 09/29/2023/7:31:34 PM    Final     30 day monitor:   Coronary calcium score IMPRESSION: Coronary calcium score of 0.   Charlton Haws     Electronically Signed   By: Charlton Haws M.D.   On: 11/07/2023 13:52  Risk Assessment/Calculations:     HYPERTENSION CONTROL Vitals:   11/22/23 0746 11/22/23 0808  BP: (!) 143/72 (!) 140/85    The patient's blood pressure is elevated above target today.  In order to address the patient's elevated BP: Blood pressure will be monitored at home to determine if medication changes need to be made.; Follow up with primary care provider for management.          Physical Exam:   VS:  BP (!) 140/85   Pulse 70   Ht 5\' 2"  (1.575 m)   Wt 222 lb 12.8 oz (101.1 kg)   SpO2 97%   BMI 40.75 kg/m    Wt Readings from Last 3 Encounters:  11/22/23 222 lb 12.8 oz (101.1 kg)  08/24/23 225 lb (102.1 kg)  07/07/23 224 lb (101.6 kg)    GEN: Obese,in no acute distress NECK: No JVD; No carotid bruits CARDIAC:  RRR, no murmurs, rubs, gallops RESPIRATORY:  Clear to auscultation without rales, wheezing or rhonchi  ABDOMEN: Soft, non-tender, non-distended EXTREMITIES:  No edema; No deformity   ASSESSMENT AND PLAN: .    Palpitations, irregular heartbeat: Symptoms have improved since cutting back on caffeine. Had trouble with wearing her monitor-allergic reactions. Hasn't sent it back in yet. factors of obesity and OSA. Await 30-day event monitor-depending on results she could be cleared for knee replacement surgery. Recommend continue to reduce caffeine intake and 150 min exercise weekly   Hypertension: Blood pressure elevated today-hasn't taken meds yet. She'll take them when she gets home and let us  know if it remains above goal of less than 135/85. Echocardiogram-normal LVEF, mild LVH and G1DD   Family history of early CAD: Coronary calcium score is 0          Dispo: f/u in 1 yr or sooner if needed.  Signed, [name], PA-C

## 2023-11-22 ENCOUNTER — Encounter: Payer: Self-pay | Admitting: Physician Assistant

## 2023-11-22 ENCOUNTER — Ambulatory Visit: Payer: Commercial Managed Care - HMO | Attending: Physician Assistant | Admitting: Physician Assistant

## 2023-11-22 VITALS — BP 140/85 | HR 70 | Ht 62.0 in | Wt 222.8 lb

## 2023-11-22 DIAGNOSIS — I10 Essential (primary) hypertension: Secondary | ICD-10-CM

## 2023-11-22 DIAGNOSIS — Z8249 Family history of ischemic heart disease and other diseases of the circulatory system: Secondary | ICD-10-CM

## 2023-11-22 DIAGNOSIS — R002 Palpitations: Secondary | ICD-10-CM

## 2023-11-22 NOTE — Patient Instructions (Signed)
 Follow-Up: At [facility], you and your health needs are our priority.  As part of our continuing mission to provide you with exceptional heart care, we have created designated Provider Care Teams.  These Care Teams include your primary Cardiologist (physician) and Advanced Practice Providers (APPs -  Physician Assistants and Nurse Practitioners) who all work together to provide you with the care you need, when you need it.  We recommend signing up for the patient portal called "MyChart".  Sign up information is provided on this After Visit Summary.  MyChart is used to connect with patients for Virtual Visits (Telemedicine).  Patients are able to view lab/test results, encounter notes, upcoming appointments, etc.  Non-urgent messages can be sent to your provider as well.   To learn more about what you can do with MyChart, go to [email or web address].    Your next appointment:   1 year(s)  Provider:   Rosemary Holms, MD  Other Instructions   1st Floor: - Lobby - Registration  - Pharmacy  - Lab - Cafe  2nd Floor: - PV Lab - Diagnostic Testing (echo, CT, nuclear med)  3rd Floor: - Vacant  4th Floor: - TCTS (cardiothoracic surgery) - AFib Clinic - Structural Heart Clinic - Vascular Surgery  - Vascular Ultrasound  5th Floor: - HeartCare Cardiology (general and EP) - Clinical Pharmacy for coumadin, hypertension, lipid, weight-loss medications, and med management appointments    Valet parking services will be available as well.

## 2023-12-01 NOTE — Progress Notes (Signed)
 Slow heart rate during sleep, possibly could be related to sleep apnea. If not previously diagnosed with sleep apnea, could consider home sleep study (Diagnosis: Bradycardia) No significant heart rhythm abnormalities noted.  Thanks MJP

## 2023-12-05 NOTE — Progress Notes (Signed)
 Noted.  Thanks MJP

## 2024-03-06 ENCOUNTER — Ambulatory Visit
Admission: RE | Admit: 2024-03-06 | Discharge: 2024-03-06 | Disposition: A | Discharge status: Home / Self Care | Source: Ambulatory Visit | Attending: Family Medicine | Admitting: Family Medicine

## 2024-03-06 VITALS — BP 169/79 | HR 86 | Temp 98.9°F | Resp 18

## 2024-03-06 DIAGNOSIS — J018 Other acute sinusitis: Secondary | ICD-10-CM

## 2024-03-06 MED ORDER — CEFDINIR 300 MG PO CAPS: 300.0000 mg | ORAL_CAPSULE | Freq: Two times a day (BID) | ORAL | 0 refills | Status: DC

## 2024-03-06 MED ORDER — PROMETHAZINE-DM 6.25-15 MG/5ML PO SYRP: 5.0000 mL | ORAL_SOLUTION | Freq: Three times a day (TID) | ORAL | 0 refills | Status: DC | PRN

## 2024-03-06 NOTE — Discharge Instructions (Addendum)
 We will manage this as a sinus infection with cefdinir. For sore throat or cough try using a honey-based tea. Use 3 teaspoons of honey with juice squeezed from half lemon. Place shaved pieces of ginger into 1/2-1 cup of water and warm over stove top. Then mix the ingredients and repeat every 4 hours as needed. Please take ibuprofen 600mg  every 6 hours with food alternating with OR taken together with Tylenol  500mg -650mg  every 6 hours for throat pain, fevers, aches and pains. Hydrate very well with at least 2 liters of water. Eat light meals such as soups (chicken and noodles, vegetable, chicken and wild rice).  Do not eat foods that you are allergic to.  Taking an antihistamine like Zyrtec can help against postnasal drainage, sinus congestion which can cause sinus pain, sinus headaches, throat pain, painful swallowing, coughing.  You can take this together with cough medication as needed.

## 2024-03-06 NOTE — ED Triage Notes (Signed)
 Cough and greenish nasal congestion x 1 week. Patient denies any fever/vomiting or diarrhea. Cough is worse at night.

## 2024-03-06 NOTE — ED Provider Notes (Signed)
 [name]  Note:  This document was prepared using Conservation officer, historic buildings and may include unintentional dictation errors.  MRN: [id number] DOB: 04/10/1961  Subjective:   Kathryn [name] is a 63 y.o. female presenting for 1 week history of persistent and worsening sinus congestion, sinus drainage, bilateral ear fullness, persistent productive coughing.  No chest pain, shortness of breath or wheezing.  No neck pain, neck stiffness, fever, nausea, vomiting, belly pain, changes to bowel or urinary habits.  No rashes.  No asthma.  No smoking of any kind including cigarettes, cigars, vaping, marijuana use.  Has a history of upper airway cough syndrome.  No current facility-administered medications for this encounter.  Current Outpatient Medications:    Multiple Vitamin (MULTIVITAMIN WITH MINERALS) TABS tablet, Take 1 tablet by mouth daily., Disp: , Rfl:    omeprazole (PRILOSEC) 20 MG capsule, Take 20 mg by mouth daily., Disp: , Rfl:    potassium chloride  (KLOR-CON  M) 10 MEQ tablet, Take 1 tablet (10 mEq total) by mouth daily., Disp: 30 tablet, Rfl: 1   valsartan -hydrochlorothiazide  (DIOVAN  HCT) 160-25 MG tablet, Take 1 tablet by mouth daily., Disp: 30 tablet, Rfl: 2   ibuprofen (ADVIL) 800 MG tablet, Take 800 mg by mouth 3 (three) times daily as needed., Disp: , Rfl:    Allergies  Allergen Reactions   Iodinated Contrast Media Other (See Comments)    Unknown : childhood reaction    Metrizamide Other (See Comments)    Unknown : childhood reaction   Penicillins Hives    Has patient had a PCN reaction causing immediate rash, facial/tongue/throat swelling, SOB or lightheadedness with hypotension: No Has patient had a PCN reaction causing severe rash involving mucus membranes or skin necrosis: No Has patient had a PCN reaction that required hospitalization: No Has patient had a PCN reaction occurring within the last 10 years: No If all of the above answers are  NO, then may proceed with Cephalosporin use.     Past Medical History:  Diagnosis Date   Accessory skin tags    Allergy 1990's   penicillin   Anxiety state 05/25/2023   Apnea, sleep 05/25/2023   Arthritis    lower back   Clinical depression 05/25/2023   Complication of anesthesia 12/2015   block paralized right diaphram - sats dropped shoulder surgery    Cough 10/24/2014   Try off acei 10/23/14 >>>   11/20/14 CT sinus >neg        Last Assessment & Plan:    Resistant cough on ACE inhibitor   Would avoid ACE inhibitor's in the future if possible   Advised on cough control   Check CT sinus     GERD (gastroesophageal reflux disease)    No longer since gastric sleeve   Goiter    Headache    migraines as a teenager   History of kidney stones     63 years old   Hypertension    not current- normal since gastric sleeve   OSA (obstructive sleep apnea)    Overactive bladder    Overactive bladder    Palpitations    Pneumonia    2005 ish, 2016   PONV (postoperative nausea and vomiting)    Thyroid  nodule      Past Surgical History:  Procedure Laterality Date   ANTERIOR CERVICAL DECOMP/DISCECTOMY FUSION Right 04/07/2017   Procedure: ANTERIOR CERVICAL DECOMPRESSION FUSION, CERVICAL 5-6, CERVICAL 6-7 WITH INSTRUMENTATION AND ALLOGRAFT;  Surgeon: Kathryn Grimes, MD;  Location:  [facility] OR;  Service: Orthopedics;  Laterality: Right;  ANTERIOR CERVICAL DECOMPRESSION FUSION, CERVICAL 5-6, CERVICAL 6-7 WITH INSTRUMENTATION AND ALLOGRAFT; REQUEST 2.5 HOURS   BTL  1983   CARDIAC CATHETERIZATION     CHOLECYSTECTOMY     CHOLECYSTECTOMY, LAPAROSCOPIC  05/2002   COLONOSCOPY     ESOPHAGOGASTRODUODENOSCOPY     KIDNEY SURGERY     age 67- 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   shoulder surgery Right 1997    x 3 Arthroscopy   TUBAL LIGATION      Family History  Problem Relation Age of  Onset   Other Mother        GASTRIC STAPLING   Gallbladder disease Mother    Early death Mother    Obesity Mother    COPD Father        smoked   Cancer - Colon Father    Cancer Father    Early death Sister    Obesity Sister    Obesity Sister    Leukemia Sister    Coronary artery disease Sister        X3   Fibroids Sister        3X   Birth defects Daughter    Varicose Veins Daughter    Diabetes Maternal Uncle    Colon polyps Maternal Grandmother    Gallbladder disease Maternal Grandfather        GASTRIC STAPLING   Diabetes Maternal Grandfather    Birth defects Brother    Leukemia Brother        has one kidney   Cirrhosis Other    Hypertension Other    Migraines Other    Breast cancer Neg Hx    Ovarian cancer Neg Hx    BRCA 1/2 Neg Hx     Social History   Tobacco Use   Smoking status: Former    Current packs/day: 0.00    Types: Cigarettes    Quit date: 09/20/1978    Years since quitting: 45.4    Passive exposure: Yes   Smokeless tobacco: Never   Tobacco comments:    SOCIAL SMOKING AS A TEENAGER  Vaping Use   Vaping status: Never Used  Substance Use Topics   Alcohol use: No   Drug use: No    ROS   Objective:   Vitals: BP (!) 169/79 (BP Location: Left Arm)   Pulse 86   Temp 98.9 F (37.2 C)   Resp 18   SpO2 95%   Physical Exam Constitutional:      General: She is not in acute distress.    Appearance: Normal appearance. She is well-developed and normal weight. She is not ill-appearing, toxic-appearing or diaphoretic.  HENT:     Head: Normocephalic and atraumatic.     Right Ear: Tympanic membrane, ear canal and external ear normal. No drainage or tenderness. No middle ear effusion. There is no impacted cerumen. Tympanic membrane is not erythematous or bulging.     Left Ear: Tympanic membrane, ear canal and external ear normal. No drainage or tenderness.  No middle ear effusion. There is no impacted cerumen. Tympanic membrane is not erythematous or  bulging.     Nose: Congestion and rhinorrhea present.     Mouth/Throat:     Mouth: Mucous membranes are moist. No oral lesions.     Pharynx: No pharyngeal swelling, oropharyngeal exudate, posterior oropharyngeal erythema or uvula  swelling.     Tonsils: No tonsillar exudate or tonsillar abscesses.     Comments: Thick streaks of postnasal drainage overlying pharynx.  Eyes:     General: No scleral icterus.       Right eye: No discharge.        Left eye: No discharge.     Extraocular Movements: Extraocular movements intact.     Right eye: Normal extraocular motion.     Left eye: Normal extraocular motion.     Conjunctiva/sclera: Conjunctivae normal.    Cardiovascular:     Rate and Rhythm: Normal rate and regular rhythm.     Heart sounds: Normal heart sounds. No murmur heard.    No friction rub. No gallop.  Pulmonary:     Effort: Pulmonary effort is normal. No respiratory distress.     Breath sounds: No stridor. No wheezing, rhonchi or rales.  Chest:     Chest wall: No tenderness.   Musculoskeletal:     Cervical back: Normal range of motion and neck supple.  Lymphadenopathy:     Cervical: No cervical adenopathy.   Skin:    General: Skin is warm and dry.   Neurological:     General: No focal deficit present.     Mental Status: She is alert and oriented to person, place, and time.   Psychiatric:        Mood and Affect: Mood normal.        Behavior: Behavior normal.     Assessment and Plan :   PDMP not reviewed this encounter.  1. Acute non-recurrent sinusitis of other sinus    Will start empiric treatment for sinusitis with cefdinir.  Recommended supportive care otherwise.  Offered prednisone but patient declined.  Counseled patient on potential for adverse effects with medications prescribed/recommended today, ER and return-to-clinic precautions discussed, patient verbalized understanding.    Adolph Hoop, [place] 03/06/24 1514

## 2024-03-31 ENCOUNTER — Other Ambulatory Visit: Payer: Self-pay | Admitting: Family Medicine

## 2024-03-31 DIAGNOSIS — I10 Essential (primary) hypertension: Secondary | ICD-10-CM

## 2024-09-17 ENCOUNTER — Ambulatory Visit: Admitting: Radiology

## 2024-09-17 ENCOUNTER — Ambulatory Visit: Payer: Self-pay | Admitting: Urgent Care

## 2024-09-17 ENCOUNTER — Ambulatory Visit
Admission: RE | Admit: 2024-09-17 | Discharge: 2024-09-17 | Disposition: A | Discharge status: Home / Self Care | Source: Ambulatory Visit | Attending: Family Medicine | Admitting: Family Medicine

## 2024-09-17 VITALS — BP 185/95 | HR 100 | Temp 99.8°F | Resp 20

## 2024-09-17 DIAGNOSIS — R21 Rash and other nonspecific skin eruption: Secondary | ICD-10-CM | POA: Diagnosis not present

## 2024-09-17 DIAGNOSIS — R197 Diarrhea, unspecified: Secondary | ICD-10-CM

## 2024-09-17 DIAGNOSIS — R053 Chronic cough: Secondary | ICD-10-CM

## 2024-09-17 DIAGNOSIS — J209 Acute bronchitis, unspecified: Secondary | ICD-10-CM

## 2024-09-17 LAB — POCT URINE DIPSTICK
Blood, UA: NEGATIVE
Glucose, UA: NEGATIVE mg/dL
Leukocytes, UA: NEGATIVE
Nitrite, UA: NEGATIVE
POC PROTEIN,UA: 100 — AB
Spec Grav, UA: 1.02
Urobilinogen, UA: 0.2 U/dL
pH, UA: 5.5

## 2024-09-17 MED ORDER — PREDNISONE 20 MG PO TABS: ORAL_TABLET | ORAL | 0 refills | Status: AC

## 2024-09-17 MED ORDER — PROMETHAZINE-DM 6.25-15 MG/5ML PO SYRP: 5.0000 mL | ORAL_SOLUTION | Freq: Every evening | ORAL | 0 refills | Status: AC | PRN

## 2024-09-17 MED ORDER — LOPERAMIDE HCL 2 MG PO CAPS: 2.0000 mg | ORAL_CAPSULE | Freq: Two times a day (BID) | ORAL | 0 refills | Status: AC | PRN

## 2024-09-17 MED ORDER — SODIUM CHLORIDE 0.9 % IV BOLUS
1000.0000 mL | Freq: Once | INTRAVENOUS | Status: AC
  Administered 2024-09-17: 1000 mL via INTRAVENOUS

## 2024-09-17 NOTE — ED Triage Notes (Signed)
 Pt reports wheezing, cough, body aches, diarrhea x 5 days. Cough (prescription)meds gives some relief.

## 2024-09-17 NOTE — ED Provider Notes (Signed)
 " [facility], [facility]  Note:  This document was prepared using Conservation officer, historic buildings and may include unintentional dictation errors.  MRN: [id number] DOB: 1960/10/27  Subjective:   Kathryn [name] is a 63 y.o. female presenting for 5-day history of persistent coughing, wheezing, coughing fits, body aches, diarrhea.  Patient was sick the first week of December and had a few days of feeling well before her current illness.  No active fevers, bloody stools, recent antibiotic use.  No history of GI disorders.  Patient does have a history of upper airway cough syndrome, pneumonia, allergies.  Was last treated for a sinus infection June 2025 with cefdinir .  No smoking of any kind including cigarettes, cigars, vaping, marijuana use.  Regarding her blood pressure, patient has not been able to take her blood pressure medicines due to her illness.  She has also not been hydrating or eating very much especially in the last 48 hours.  Also reports that she has had an itchy, irritated rash over her back that started December with her initial illness.  Current Outpatient Medications  Medication Instructions   ibuprofen (ADVIL) 800 mg, 3 times daily PRN   Multiple Vitamin (MULTIVITAMIN WITH MINERALS) TABS tablet 1 tablet, Daily   omeprazole (PRILOSEC) 20 mg, Daily   potassium chloride  (KLOR-CON  M) 10 MEQ tablet 10 mEq, Oral, Daily   valsartan -hydrochlorothiazide  (DIOVAN -HCT) 160-25 MG tablet 1 tablet, Oral, Daily, Needs office visit for refills.    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/25/2023   Complication of anesthesia 12/2015   block paralized right diaphram - sats dropped shoulder surgery    Cough 10/24/2014   Try off acei 10/23/14 >>>   11/20/14 CT sinus >neg        Last Assessment & Plan:     Resistant cough on ACE inhibitor   Would avoid ACE inhibitor's in the future if possible   Advised on cough control   Check CT sinus     GERD (gastroesophageal reflux disease)    No longer since gastric sleeve   Goiter    Headache    migraines as a teenager   History of kidney stones     63 years old   Hypertension    not current- normal since gastric sleeve   OSA (obstructive sleep apnea)    Overactive bladder    Overactive bladder    Palpitations    Pneumonia    2005 ish, 2016   PONV (postoperative nausea and vomiting)    Thyroid  nodule      Past Surgical History:  Procedure Laterality Date   ANTERIOR CERVICAL DECOMP/DISCECTOMY FUSION Right 04/07/2017   Procedure: ANTERIOR CERVICAL DECOMPRESSION FUSION, CERVICAL 5-6, CERVICAL 6-7 WITH INSTRUMENTATION AND ALLOGRAFT;  Surgeon: Beuford Anes, MD;  Location: [facility] OR;  Service: Orthopedics;  Laterality: Right;  ANTERIOR CERVICAL DECOMPRESSION FUSION, CERVICAL 5-6, CERVICAL 6-7 WITH INSTRUMENTATION AND ALLOGRAFT; REQUEST 2.5 HOURS   BTL  1983   CARDIAC CATHETERIZATION     CHOLECYSTECTOMY     CHOLECYSTECTOMY, LAPAROSCOPIC  05/2002   COLONOSCOPY     ESOPHAGOGASTRODUODENOSCOPY     KIDNEY SURGERY     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/02/2016   shoulder surgery Right 1997    x 3 Arthroscopy   TUBAL LIGATION      Family History  Problem Relation Age of Onset   Other Mother        GASTRIC STAPLING   Gallbladder disease Mother    Early death Mother    Obesity Mother    COPD Father        smoked   Cancer - Colon Father    Cancer Father    Early death Sister    Obesity Sister    Obesity Sister    Leukemia Sister    Coronary artery disease Sister        X3   Fibroids Sister        3X   Birth defects Daughter    Varicose Veins Daughter    Diabetes Maternal Uncle    Colon polyps Maternal Grandmother     Gallbladder disease Maternal Grandfather        GASTRIC STAPLING   Diabetes Maternal Grandfather    Birth defects Brother    Leukemia Brother        has one kidney   Cirrhosis Other    Hypertension Other    Migraines Other    Breast cancer Neg Hx    Ovarian cancer Neg Hx    BRCA 1/2 Neg Hx     Social History   Occupational History   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 MY HUSBAND LAST PERSON     ROS   Objective:   Vitals: BP (!) 185/95 (BP Location: Right Arm) Comment: States she is not taking her medsfor high blood pressure  Pulse 100   Temp 99.8 F (37.7 C) (Oral)   Resp 20   SpO2 92%   Physical Exam Constitutional:      General: She is not in acute distress.    Appearance: Normal appearance. She is well-developed. She is not ill-appearing, toxic-appearing or diaphoretic.  HENT:     Head: Normocephalic and atraumatic.     Right Ear: External ear normal.     Left Ear: External ear normal.     Nose: Nose normal.     Mouth/Throat:     Mouth: Mucous membranes are moist.  Eyes:     General: No scleral icterus.       Right eye: No discharge.        Left eye: No discharge.     Extraocular Movements: Extraocular movements intact.     Conjunctiva/sclera: Conjunctivae normal.  Cardiovascular:     Rate and Rhythm: Normal rate and regular rhythm.     Heart sounds: Normal heart sounds. No murmur heard.    No friction rub. No gallop.  Pulmonary:     Effort: Pulmonary effort is normal. No  respiratory distress.     Breath sounds: No stridor. No wheezing, rhonchi or rales.  Comments: Persistent coughing spells in clinic. Chest:     Chest wall: No tenderness.  Abdominal:     General: Bowel sounds are normal. There is no distension.     Palpations: Abdomen is soft. There is no mass.     Tenderness: There is no abdominal tenderness. There is no right CVA tenderness, left CVA tenderness, guarding or rebound.  Skin:    General: Skin is warm and dry.     Findings: Rash (multiple erythematic inflammatory lesions scattered over the mid to upper back) present.  Neurological:     General: No focal deficit present.     Mental Status: She is alert and oriented to person, place, and time.  Psychiatric:        Mood and Affect: Mood normal.        Behavior: Behavior normal.        Thought Content: Thought content normal.        Judgment: Judgment normal.     Results for orders placed or performed during the hospital encounter of 09/17/24 (from the past 24 hours)  POCT URINE DIPSTICK     Status: Abnormal   Collection Time: 09/17/24 10:28 AM  Result Value Ref Range   Color, UA yellow yellow   Clarity, UA clear clear   Glucose, UA negative negative mg/dL   Bilirubin, UA moderate (A) negative   Ketones, POC UA >= (160) (A) negative mg/dL   Spec Grav, UA 8.979 8.989 - 1.025   Blood, UA negative negative   pH, UA 5.5 5.0 - 8.0   POC PROTEIN,UA =100 (A) negative, trace   Urobilinogen, UA 0.2 0.2 or 1.0 E.U./dL   Nitrite, UA Negative Negative   Leukocytes, UA Negative Negative   IV fluid bolus administered at 1,000cc over a period of 60 minutes.  DG Chest 2 View Result Date: 09/17/2024 EXAM: 2 VIEW(S) XRAY OF THE CHEST 09/17/2024 12:04:43 PM COMPARISON: 05/25/2023 CLINICAL HISTORY: coughing FINDINGS: LUNGS AND PLEURA: Mild peribronchial thickening may reflect bronchitis. No focal pulmonary opacity. No pleural effusion. No pneumothorax. HEART AND MEDIASTINUM: The heart is  borderline in size. No acute abnormality of the mediastinal silhouette. BONES AND SOFT TISSUES: No acute osseous abnormality. IMPRESSION: 1. Mild peribronchial thickening, which may reflect bronchitis. Electronically signed by: Franky Crease MD 09/17/2024 01:43 PM EST RP Workstation: [id number]   Assessment and Plan :   PDMP not reviewed this encounter.  1. Acute bronchitis, unspecified organism   2. Diarrhea, unspecified type   3. Rash and nonspecific skin eruption   4. Persistent cough      Will use a round of prednisone.  Emphasized need for compliance with the blood pressure medication.  Recommend supportive care. Counseled patient on potential for adverse effects with medications prescribed/recommended today, ER and return-to-clinic precautions discussed, patient verbalized understanding.     [1]  Allergies Allergen Reactions   Iodinated Contrast Media Other (See Comments)    Unknown : childhood reaction    Metrizamide Other (See Comments)    Unknown : childhood reaction   Penicillins Hives    Has patient had a PCN reaction causing immediate rash, facial/tongue/throat swelling, SOB or lightheadedness with hypotension: No Has patient had a PCN reaction causing severe rash involving mucus membranes or skin necrosis: No Has patient had a PCN reaction that required hospitalization: No Has patient had a PCN reaction occurring within the last 10 years: No If all of the above answers are NO, then may proceed with Cephalosporin use.  Christopher Savannah, [place] 09/17/24 1353  "

## 2024-09-17 NOTE — Discharge Instructions (Addendum)
 Please start prednisone to help with your bronchitis, bronchospasms, persistent coughing.  Make sure you are pushing fluids.  Use the cough syrup as needed.  Use loperamide to slow down your bouts of diarrhea.  Make sure that you are trying to take your blood pressure medicine.
